# Patient Record
Sex: Female | Born: 1990 | Race: Black or African American | Hispanic: No | Marital: Single | Smoking: Never smoker
Health system: Southern US, Community
[De-identification: ages and names within clinical notes are randomized; demographics above are authoritative.]

## PROBLEM LIST (undated history)

## (undated) DIAGNOSIS — Z789 Other specified health status: Secondary | ICD-10-CM

## (undated) HISTORY — PX: NO PAST SURGERIES: SHX2092

---

## 2017-01-25 ENCOUNTER — Encounter (HOSPITAL_COMMUNITY): Payer: Self-pay

## 2017-01-25 ENCOUNTER — Other Ambulatory Visit (HOSPITAL_COMMUNITY): Payer: Self-pay | Admitting: Nurse Practitioner

## 2017-01-25 DIAGNOSIS — Z3682 Encounter for antenatal screening for nuchal translucency: Secondary | ICD-10-CM

## 2017-01-25 DIAGNOSIS — Z3A13 13 weeks gestation of pregnancy: Secondary | ICD-10-CM

## 2017-01-27 ENCOUNTER — Encounter (HOSPITAL_COMMUNITY): Payer: Self-pay

## 2017-01-27 ENCOUNTER — Ambulatory Visit (HOSPITAL_COMMUNITY)
Admission: RE | Admit: 2017-01-27 | Discharge: 2017-01-27 | Disposition: A | Discharge status: Home / Self Care | Payer: Medicaid Other | Source: Ambulatory Visit | Attending: Nurse Practitioner | Admitting: Nurse Practitioner

## 2017-01-27 DIAGNOSIS — Z3A13 13 weeks gestation of pregnancy: Secondary | ICD-10-CM | POA: Insufficient documentation

## 2017-01-27 DIAGNOSIS — Z3682 Encounter for antenatal screening for nuchal translucency: Secondary | ICD-10-CM | POA: Insufficient documentation

## 2017-01-27 HISTORY — DX: Other specified health status: Z78.9

## 2017-01-27 NOTE — Addendum Note (Signed)
Encounter addended by: Levonne HubertStalter, Octavius Shin M, RDMS, RVT on: 01/27/2017 9:13 AM  Actions taken: Imaging Exam ended

## 2017-02-02 LAB — OB RESULTS CONSOLE VARICELLA ZOSTER ANTIBODY, IGG: VARICELLA IGG: IMMUNE

## 2017-02-02 LAB — OB RESULTS CONSOLE ABO/RH: RH Type: NEGATIVE

## 2017-02-02 LAB — OB RESULTS CONSOLE HEPATITIS B SURFACE ANTIGEN: Hepatitis B Surface Ag: NEGATIVE

## 2017-02-02 LAB — OB RESULTS CONSOLE HIV ANTIBODY (ROUTINE TESTING): HIV: NONREACTIVE

## 2017-02-02 LAB — OB RESULTS CONSOLE RUBELLA ANTIBODY, IGM: Rubella: NON-IMMUNE/NOT IMMUNE

## 2017-02-02 LAB — OB RESULTS CONSOLE RPR: RPR: NONREACTIVE

## 2017-02-13 ENCOUNTER — Other Ambulatory Visit (HOSPITAL_COMMUNITY): Payer: Self-pay

## 2017-02-21 NOTE — L&D Delivery Note (Signed)
Denise Abbott is a 27 y.o. female G3P0020 with IUP at 936w0d admitted for SROM.  She progressed with pitocin augmentation to complete and pushed approximately 2 hours to deliver. The delivery was complicated by a shoulder dystocia    Delivery Note At 12:24 AM a viable female was delivered via Vaginal, Spontaneous (Presentation: LOA).  APGAR: 8, 9; weight pending.   Placenta status: spontaneous, intact.  Cord: 3 vessels. Cord pH: 7.246  At delivery of the head, turtle sign was noted and shoulder dystocia identified.  First Maneuver: Chief Operating OfficerMcRoberts Second Maneuver: Suprapubic pressure, attempted [place] and rubin maneuvers with no movement.  Dr. Jolayne Pantheronstant called to bedside. Code APGAR called.  Third Maneuver: Left side lying Fourth Maneuver: [place] Fifth maneuver: right side lying and delivery of posterior arm  Cord was clamped and cut by CNM and infant handed to awaiting NICU team. See NICU note  Total Time of dystocia: approximately 2 minutes  Anesthesia: epidural Episiotomy: None Lacerations: 2nd degree;Perineal Suture Repair: 3.0 vicryl Est. Blood Loss (mL):  100  Mom to postpartum.  Baby to Couplet care / Skin to Skin.  Rolm BookbinderCaroline M Thad Osoria CNM 07/23/2017, 1:06 AM

## 2017-03-03 ENCOUNTER — Other Ambulatory Visit (HOSPITAL_COMMUNITY): Payer: Self-pay | Admitting: Nurse Practitioner

## 2017-03-03 DIAGNOSIS — Z3689 Encounter for other specified antenatal screening: Secondary | ICD-10-CM

## 2017-03-07 ENCOUNTER — Encounter (HOSPITAL_COMMUNITY): Payer: Self-pay

## 2017-03-07 ENCOUNTER — Ambulatory Visit (HOSPITAL_COMMUNITY)
Admission: RE | Admit: 2017-03-07 | Discharge: 2017-03-07 | Disposition: A | Discharge status: Home / Self Care | Payer: Medicaid Other | Source: Ambulatory Visit | Attending: Nurse Practitioner | Admitting: Nurse Practitioner

## 2017-03-07 ENCOUNTER — Other Ambulatory Visit (HOSPITAL_COMMUNITY): Payer: Self-pay | Admitting: Nurse Practitioner

## 2017-03-07 DIAGNOSIS — Z3A19 19 weeks gestation of pregnancy: Secondary | ICD-10-CM

## 2017-03-07 DIAGNOSIS — Z363 Encounter for antenatal screening for malformations: Secondary | ICD-10-CM

## 2017-03-07 DIAGNOSIS — Z36 Encounter for antenatal screening for chromosomal anomalies: Secondary | ICD-10-CM | POA: Insufficient documentation

## 2017-03-07 DIAGNOSIS — O99311 Alcohol use complicating pregnancy, first trimester: Secondary | ICD-10-CM

## 2017-03-07 DIAGNOSIS — Z3689 Encounter for other specified antenatal screening: Secondary | ICD-10-CM

## 2017-05-15 ENCOUNTER — Encounter (HOSPITAL_COMMUNITY): Payer: Self-pay

## 2017-06-29 LAB — OB RESULTS CONSOLE GC/CHLAMYDIA
CHLAMYDIA, DNA PROBE: NEGATIVE
GC PROBE AMP, GENITAL: NEGATIVE

## 2017-06-29 LAB — OB RESULTS CONSOLE GBS: STREP GROUP B AG: NEGATIVE

## 2017-07-21 ENCOUNTER — Inpatient Hospital Stay (HOSPITAL_COMMUNITY)
Admission: AD | Admit: 2017-07-21 | Discharge: 2017-07-25 | DRG: 807 | Disposition: A | Discharge status: Home / Self Care | Payer: Medicaid Other | Attending: Obstetrics and Gynecology | Admitting: Obstetrics and Gynecology

## 2017-07-21 ENCOUNTER — Other Ambulatory Visit: Payer: Self-pay

## 2017-07-21 ENCOUNTER — Encounter (HOSPITAL_COMMUNITY): Payer: Self-pay | Admitting: *Deleted

## 2017-07-21 DIAGNOSIS — Z6791 Unspecified blood type, Rh negative: Secondary | ICD-10-CM

## 2017-07-21 DIAGNOSIS — O26893 Other specified pregnancy related conditions, third trimester: Secondary | ICD-10-CM | POA: Diagnosis present

## 2017-07-21 DIAGNOSIS — O4292 Full-term premature rupture of membranes, unspecified as to length of time between rupture and onset of labor: Secondary | ICD-10-CM | POA: Diagnosis present

## 2017-07-21 DIAGNOSIS — Z3A38 38 weeks gestation of pregnancy: Secondary | ICD-10-CM | POA: Diagnosis not present

## 2017-07-21 DIAGNOSIS — O4290 Premature rupture of membranes, unspecified as to length of time between rupture and onset of labor, unspecified weeks of gestation: Secondary | ICD-10-CM | POA: Diagnosis present

## 2017-07-21 LAB — CBC
HEMATOCRIT: 37.2 % (ref 36.0–46.0)
Hemoglobin: 12.3 g/dL (ref 12.0–15.0)
MCH: 29.1 pg (ref 26.0–34.0)
MCHC: 33.1 g/dL (ref 30.0–36.0)
MCV: 88.2 fL (ref 78.0–100.0)
PLATELETS: 251 10*3/uL (ref 150–400)
RBC: 4.22 MIL/uL (ref 3.87–5.11)
RDW: 14.5 % (ref 11.5–15.5)
WBC: 9.6 10*3/uL (ref 4.0–10.5)

## 2017-07-21 LAB — POCT FERN TEST: POCT Fern Test: POSITIVE

## 2017-07-21 MED ORDER — FENTANYL CITRATE (PF) 100 MCG/2ML IJ SOLN
100.0000 ug | INTRAMUSCULAR | Status: DC | PRN
  Administered 2017-07-22 – 2017-07-23 (×2): 100 ug via INTRAVENOUS
  Filled 2017-07-21 (×2): qty 2

## 2017-07-21 MED ORDER — ACETAMINOPHEN 325 MG PO TABS: 650.0000 mg | ORAL_TABLET | ORAL | Status: DC | PRN

## 2017-07-21 MED ORDER — LACTATED RINGERS IV SOLN
INTRAVENOUS | Status: DC
  Administered 2017-07-21: 23:00:00 via INTRAVENOUS

## 2017-07-21 MED ORDER — OXYCODONE-ACETAMINOPHEN 5-325 MG PO TABS: 2.0000 | ORAL_TABLET | ORAL | Status: DC | PRN

## 2017-07-21 MED ORDER — OXYTOCIN 40 UNITS IN LACTATED RINGERS INFUSION - SIMPLE MED
2.5000 [IU]/h | INTRAVENOUS | Status: DC
  Administered 2017-07-23: 2.5 [IU]/h via INTRAVENOUS

## 2017-07-21 MED ORDER — SOD CITRATE-CITRIC ACID 500-334 MG/5ML PO SOLN: 30.0000 mL | ORAL | Status: DC | PRN

## 2017-07-21 MED ORDER — ONDANSETRON HCL 4 MG/2ML IJ SOLN: 4.0000 mg | Freq: Four times a day (QID) | INTRAMUSCULAR | Status: DC | PRN

## 2017-07-21 MED ORDER — LIDOCAINE HCL (PF) 1 % IJ SOLN
30.0000 mL | INTRAMUSCULAR | Status: AC | PRN
  Administered 2017-07-23: 30 mL via SUBCUTANEOUS
  Filled 2017-07-21: qty 30

## 2017-07-21 MED ORDER — LACTATED RINGERS IV SOLN
500.0000 mL | INTRAVENOUS | Status: DC | PRN
  Administered 2017-07-22: 500 mL via INTRAVENOUS

## 2017-07-21 MED ORDER — OXYTOCIN BOLUS FROM INFUSION
500.0000 mL | Freq: Once | INTRAVENOUS | Status: AC
  Administered 2017-07-23: 500 mL via INTRAVENOUS

## 2017-07-21 MED ORDER — OXYCODONE-ACETAMINOPHEN 5-325 MG PO TABS: 1.0000 | ORAL_TABLET | ORAL | Status: DC | PRN

## 2017-07-21 NOTE — MAU Note (Signed)
Leaking fld for an hour. Clear fld. No pain. Does not know how dilated at last exam 2 wks ago. Was not cked this wk at appt

## 2017-07-21 NOTE — H&P (Signed)
Denise Abbott is a 27 y.o. female G3P0020 @ 38.wks by 13wk U/S presenting for leaking fluid since 2100. Feeling some mild cramping but no painful ctx. Denies H/A, N/V/D or visual disturbances. Her preg has been followed by the [facility] service and has been essentially unremarkable other than 1) Rh neg 2) rubella nonimmune 3) EIF in LV 4) GBS neg  OB History    Gravida  3   Para      Term      Preterm      AB  2   Living  0     SAB      TAB  2   Ectopic      Multiple      Live Births             Past Medical History:  Diagnosis Date  . Medical history non-contributory    Past Surgical History:  Procedure Laterality Date  . NO PAST SURGERIES     Family History: family history is not on file. Social History:  reports that she has never smoked. She has never used smokeless tobacco. She reports that she does not drink alcohol or use drugs.     Maternal Diabetes: No Genetic Screening: Normal Maternal Ultrasounds/Referrals: Abnormal:  Findings:   Isolated EIF (echogenic intracardiac focus) Fetal Ultrasounds or other Referrals:  None Maternal Substance Abuse:  No Significant Maternal Medications:  None Significant Maternal Lab Results:  Lab values include: Group B Strep negative, Rh negative Other Comments:  None  ROS History Dilation: 2 Effacement (%): 70, 80 Station: -3 Exam by:: K. weissRN Blood pressure 114/72, pulse (!) 117, temperature 99.1 F (37.3 C), resp. rate 18, height 5' 9" (1.753 m), weight 82.6 kg (182 lb), last menstrual period 10/26/2016. Exam Physical Exam  Constitutional: She is oriented to person, place, and time. She appears well-developed.  HENT:  Head: Normocephalic.  Neck: Normal range of motion.  Cardiovascular:  Sl tachycardic  Respiratory: Effort normal.  GI:  FHR 140s, decreased variability on initial monitoring, occ variable, 10x10accels Ctx irreg  Genitourinary:  Genitourinary Comments: Leaking clear fluid  Neurological: She  is alert and oriented to person, place, and time.  Skin: Skin is warm and dry.  Psychiatric: She has a normal mood and affect. Her behavior is normal. Thought content normal.    Prenatal labs: (02/02/17) ABO, Rh:  A neg Antibody:  neg Rubella:  nonimmune RPR:   NR HBsAg:   neg HIV:   NR GBS:   neg (06/29/17)  Assessment/Plan: IUP@term ROM GBS neg  Admit to [facility] Expectant management to start per pt request; may need augmentation with Pit later on Anticipate SVD Will need Rhogam work up/rec MMR postpartum   SHAW, KIMBERLY CNM 07/21/2017, 10:45 PM    

## 2017-07-22 ENCOUNTER — Inpatient Hospital Stay (HOSPITAL_COMMUNITY): Payer: Medicaid Other | Admitting: Anesthesiology

## 2017-07-22 LAB — RPR: RPR: NONREACTIVE

## 2017-07-22 MED ORDER — EPHEDRINE 5 MG/ML INJ
10.0000 mg | INTRAVENOUS | Status: DC | PRN
  Filled 2017-07-22: qty 2

## 2017-07-22 MED ORDER — PHENYLEPHRINE 40 MCG/ML (10ML) SYRINGE FOR IV PUSH (FOR BLOOD PRESSURE SUPPORT)
80.0000 ug | PREFILLED_SYRINGE | INTRAVENOUS | Status: DC | PRN
  Filled 2017-07-22: qty 10
  Filled 2017-07-22: qty 5

## 2017-07-22 MED ORDER — DIPHENHYDRAMINE HCL 50 MG/ML IJ SOLN: 12.5000 mg | INTRAMUSCULAR | Status: DC | PRN

## 2017-07-22 MED ORDER — LIDOCAINE HCL (PF) 1 % IJ SOLN
INTRAMUSCULAR | Status: DC | PRN
  Administered 2017-07-22: 5 mL via EPIDURAL

## 2017-07-22 MED ORDER — BUPIVACAINE HCL (PF) 0.25 % IJ SOLN
INTRAMUSCULAR | Status: DC | PRN
  Administered 2017-07-22: 8 mL via EPIDURAL

## 2017-07-22 MED ORDER — PHENYLEPHRINE 40 MCG/ML (10ML) SYRINGE FOR IV PUSH (FOR BLOOD PRESSURE SUPPORT): 80.0000 ug | PREFILLED_SYRINGE | INTRAVENOUS | Status: DC | PRN

## 2017-07-22 MED ORDER — EPHEDRINE 5 MG/ML INJ: 10.0000 mg | INTRAVENOUS | Status: DC | PRN

## 2017-07-22 MED ORDER — LACTATED RINGERS IV SOLN: 500.0000 mL | Freq: Once | INTRAVENOUS | Status: DC

## 2017-07-22 MED ORDER — OXYTOCIN 40 UNITS IN LACTATED RINGERS INFUSION - SIMPLE MED
1.0000 m[IU]/min | INTRAVENOUS | Status: DC
  Administered 2017-07-22: 2 m[IU]/min via INTRAVENOUS
  Filled 2017-07-22: qty 1000

## 2017-07-22 MED ORDER — FENTANYL 2.5 MCG/ML BUPIVACAINE 1/10 % EPIDURAL INFUSION (WH - ANES)
14.0000 mL/h | INTRAMUSCULAR | Status: DC | PRN
  Administered 2017-07-22 – 2017-07-23 (×3): 14 mL/h via EPIDURAL
  Filled 2017-07-22 (×3): qty 100

## 2017-07-22 MED ORDER — TERBUTALINE SULFATE 1 MG/ML IJ SOLN
0.2500 mg | Freq: Once | INTRAMUSCULAR | Status: DC | PRN
  Filled 2017-07-22: qty 1

## 2017-07-22 MED ORDER — PHENYLEPHRINE 40 MCG/ML (10ML) SYRINGE FOR IV PUSH (FOR BLOOD PRESSURE SUPPORT)
80.0000 ug | PREFILLED_SYRINGE | INTRAVENOUS | Status: DC | PRN
  Filled 2017-07-22: qty 5

## 2017-07-22 MED ORDER — SODIUM BICARBONATE 8.4 % IV SOLN
INTRAVENOUS | Status: DC | PRN
  Administered 2017-07-22: 3 mL via EPIDURAL

## 2017-07-22 NOTE — Progress Notes (Signed)
LABOR PROGRESS NOTE  Denise Abbott is a 27 y.o. G3P0020 at 3157w6d  admitted for PROM Subjective: As pt was being redosed for the epidural, fetus experienced rapid decel w/ HR to the 50s.   Objective: BP (!) 146/78   Pulse 92   Temp 98.6 F (37 C) (Oral)   Resp 16   Ht 5\' 9"  (1.753 m)   Wt 182 lb (82.6 kg)   LMP 10/26/2016   BMI 26.88 kg/m  or  Vitals:   07/22/17 1531 07/22/17 1602 07/22/17 1728 07/22/17 1736  BP: 119/68 105/68 110/67 (!) 146/78  Pulse: 70 (!) 57 92 92  Resp: 16     Temp: 98.6 F (37 C)     TempSrc: Oral     Weight:      Height:       Cervix: Rapid change from 5-7 cm dilation FHT: origanolly in 50s, recovered to 140s after NRB O2, fetal scalp electrode placemnt. Mod var   Labs: Lab Results  Component Value Date   WBC 9.6 07/21/2017   HGB 12.3 07/21/2017   HCT 37.2 07/21/2017   MCV 88.2 07/21/2017   PLT 251 07/21/2017    Patient Active Problem List   Diagnosis Date Noted  . Amniotic fluid leaking 07/21/2017    Assessment / Plan: 27 y.o. G3P0020 at 6157w6d here for PROM  Labor: rapid progression from 5 to 7. May have caused FHR do drop. Quickly recovered. Will cont to monitor.  Fetal Wellbeing:  See above Pain Control:  Epidural Anticipated MOD:  NSVD  Garnette GunnerAaron B Thompson, MD PGY-1 6/1/20195:38 PM

## 2017-07-22 NOTE — Progress Notes (Signed)
Plan to allow pt to eat a light breakfast and then assess to start pitocin. Pt ordering now.  NAD.

## 2017-07-22 NOTE — Progress Notes (Signed)
LABOR PROGRESS NOTE  Denise Abbott is a 27 y.o. G3P0020 at 5353w6d  admitted for PROM   Subjective: Feeling comfortable  With epidural  Objective: BP 124/74   Pulse 75   Temp 98.3 F (36.8 C) (Oral)   Resp 16   Ht 5\' 9"  (1.753 m)   Wt 182 lb (82.6 kg)   LMP 10/26/2016   BMI 26.88 kg/m  or  Vitals:   07/22/17 1146 07/22/17 1151 07/22/17 1156 07/22/17 1201  BP: 123/80 117/76 129/75 124/74  Pulse: 76 84 79 75  Resp:      Temp:      TempSrc:      Weight:      Height:       Dilation: 4.5 Effacement (%): 70 Cervical Position: Posterior Station: -1 Presentation: Vertex Exam by:: Jean RosenthalJackson MD FHT: 130, accels present, mod var Uterine activity: every 3-4 min   Labs: Lab Results  Component Value Date   WBC 9.6 07/21/2017   HGB 12.3 07/21/2017   HCT 37.2 07/21/2017   MCV 88.2 07/21/2017   PLT 251 07/21/2017    Patient Active Problem List   Diagnosis Date Noted  . Amniotic fluid leaking 07/21/2017    Assessment / Plan: 27 y.o. G3P0020 at 3053w6d here for PROM  Labor: progressing with pit Fetal Wellbeing:  Cat 1 Pain Control:  epi Anticipated MOD:  NSVD  Garnette GunnerAaron B Thompson, MD PGY-1 6/1/201912:12 PM

## 2017-07-22 NOTE — Anesthesia Pain Management Evaluation Note (Signed)
  CRNA Pain Management Visit Note  Patient: Denise Abbott, 27 y.o., female  "Hello I am a member of the anesthesia team at [facility]. We have an anesthesia team available at all times to provide care throughout the hospital, including epidural management and anesthesia for C-section. I don't know your plan for the delivery whether it a natural birth, water birth, IV sedation, nitrous supplementation, doula or epidural, but we want to meet your pain goals."   1.Was your pain managed to your expectations on prior hospitalizations?   No prior hospitalizations  2.What is your expectation for pain management during this hospitalization?     Epidural  3.How can we help you reach that goal? Epidural in place. Patient said pain decreased but still uncomfortable with contractions. RN gave a patient 2 boluses from pump. Told RN and patient to notify anesthesia if boluses do not help with discomfort.   Record the patient's initial score and the patient's pain goal.   Pain: 5  Pain Goal: 5 [facility] wants you to be able to say your pain was always managed very well.  Denise Abbott 07/22/2017

## 2017-07-22 NOTE — Anesthesia Procedure Notes (Signed)
Epidural Patient location during procedure: OB Start time: 07/22/2017 11:28 AM End time: 07/22/2017 11:34 AM  Staffing Anesthesiologist: Shelton SilvasHollis, Kevin D, MD Performed: anesthesiologist   Preanesthetic Checklist Completed: patient identified, site marked, surgical consent, pre-op evaluation, timeout performed, IV checked, risks and benefits discussed and monitors and equipment checked  Epidural Patient position: sitting Prep: ChloraPrep Patient monitoring: heart rate, continuous pulse ox and blood pressure Approach: midline Location: L3-L4 Injection technique: LOR saline  Needle:  Needle type: Tuohy  Needle gauge: 17 G Needle length: 9 cm Catheter type: closed end flexible Catheter size: 20 Guage Test dose: negative and 1.5% lidocaine  Assessment Events: blood not aspirated, injection not painful, no injection resistance and no paresthesia  Additional Notes LOR @ 5  Patient identified. Risks/Benefits/Options discussed with patient including but not limited to bleeding, infection, nerve damage, paralysis, failed block, incomplete pain control, headache, blood pressure changes, nausea, vomiting, reactions to medications, itching and postpartum back pain. Confirmed with bedside nurse the patient's most recent platelet count. Confirmed with patient that they are not currently taking any anticoagulation, have any bleeding history or any family history of bleeding disorders. Patient expressed understanding and wished to proceed. All questions were answered. Sterile technique was used throughout the entire procedure. Please see nursing notes for vital signs. Test dose was given through epidural catheter and negative prior to continuing to dose epidural or start infusion. Warning signs of high block given to the patient including shortness of breath, tingling/numbness in hands, complete motor block, or any concerning symptoms with instructions to call for help. Patient was given instructions on  fall risk and not to get out of bed. All questions and concerns addressed with instructions to call with any issues or inadequate analgesia.    Reason for block:procedure for pain

## 2017-07-22 NOTE — Progress Notes (Signed)
Patient ID: Denise Abbott, female   DOB: 01-12-1991, 27 y.o.   MRN: [id number]  Feeling some ctx a little stronger than earlier  BP 121/82, other VSS FHR 130s, +accels, no decels, occ variables Ctx irreg Cx deferred  IUP@term  PROM GBS neg  After a light breakfast, will start Pit Anticipate SVD  Cam HaiSHAW, Letetia Romanello CNM 07/22/2017 7:55 AM

## 2017-07-22 NOTE — Anesthesia Preprocedure Evaluation (Signed)
Anesthesia Evaluation  Patient identified by MRN, date of birth, ID band Patient awake    Reviewed: Allergy & Precautions, Patient's Chart, lab work & pertinent test results  Airway Mallampati: I       Dental no notable dental hx.    Pulmonary neg pulmonary ROS,    Pulmonary exam normal        Cardiovascular negative cardio ROS Normal cardiovascular exam     Neuro/Psych negative neurological ROS  negative psych ROS   GI/Hepatic negative GI ROS,   Endo/Other  negative endocrine ROS  Renal/GU      Musculoskeletal negative musculoskeletal ROS (+)   Abdominal   Peds  Hematology negative hematology ROS (+)   Anesthesia Other Findings   Reproductive/Obstetrics (+) Pregnancy                             Anesthesia Physical Anesthesia Plan  ASA: II  Anesthesia Plan: Epidural   Post-op Pain Management:    Induction:   PONV Risk Score and Plan:   Airway Management Planned: Natural Airway  Additional Equipment:   Intra-op Plan:   Post-operative Plan:   Informed Consent: I have reviewed the patients History and Physical, chart, labs and discussed the procedure including the risks, benefits and alternatives for the proposed anesthesia with the patient or authorized representative who has indicated his/her understanding and acceptance.     Plan Discussed with:   Anesthesia Plan Comments: (Lab Results      Component                Value               Date                      WBC                      9.6                 07/21/2017                HGB                      12.3                07/21/2017                HCT                      37.2                07/21/2017                MCV                      88.2                07/21/2017                PLT                      251                 07/21/2017           )        Anesthesia Quick Evaluation

## 2017-07-22 NOTE — Progress Notes (Signed)
Pt has finished eating.  Will start pit.

## 2017-07-23 ENCOUNTER — Encounter (HOSPITAL_COMMUNITY): Payer: Self-pay | Admitting: *Deleted

## 2017-07-23 DIAGNOSIS — Z3A38 38 weeks gestation of pregnancy: Secondary | ICD-10-CM

## 2017-07-23 LAB — COMPREHENSIVE METABOLIC PANEL
ALBUMIN: 2.4 g/dL — AB (ref 3.5–5.0)
ALK PHOS: 138 U/L — AB (ref 38–126)
ALT: 23 U/L (ref 14–54)
ANION GAP: 12 (ref 5–15)
AST: 43 U/L — AB (ref 15–41)
BILIRUBIN TOTAL: 0.8 mg/dL (ref 0.3–1.2)
BUN: 6 mg/dL (ref 6–20)
CALCIUM: 8.9 mg/dL (ref 8.9–10.3)
CO2: 18 mmol/L — AB (ref 22–32)
Chloride: 102 mmol/L (ref 101–111)
Creatinine, Ser: 0.66 mg/dL (ref 0.44–1.00)
GFR calc Af Amer: >60 mL/min (ref >60)
GFR calc non Af Amer: >60 mL/min (ref >60)
GLUCOSE: 102 mg/dL — AB (ref 65–99)
POTASSIUM: 4.1 mmol/L (ref 3.5–5.1)
SODIUM: 132 mmol/L — AB (ref 135–145)
Total Protein: 5.8 g/dL — ABNORMAL LOW (ref 6.5–8.1)

## 2017-07-23 LAB — CBC
HEMATOCRIT: 36.9 % (ref 36.0–46.0)
HEMOGLOBIN: 12.5 g/dL (ref 12.0–15.0)
MCH: 28.9 pg (ref 26.0–34.0)
MCHC: 33.9 g/dL (ref 30.0–36.0)
MCV: 85.4 fL (ref 78.0–100.0)
Platelets: 224 10*3/uL (ref 150–400)
RBC: 4.32 MIL/uL (ref 3.87–5.11)
RDW: 14.3 % (ref 11.5–15.5)
WBC: 15.4 10*3/uL — AB (ref 4.0–10.5)

## 2017-07-23 LAB — PROTEIN / CREATININE RATIO, URINE
Creatinine, Urine: 101 mg/dL
PROTEIN CREATININE RATIO: 0.22 mg/mg{creat} — AB (ref 0.00–0.15)
Total Protein, Urine: 22 mg/dL

## 2017-07-23 MED ORDER — SENNOSIDES-DOCUSATE SODIUM 8.6-50 MG PO TABS
2.0000 | ORAL_TABLET | ORAL | Status: DC
  Administered 2017-07-23 – 2017-07-24 (×2): 2 via ORAL
  Filled 2017-07-23 (×2): qty 2

## 2017-07-23 MED ORDER — WITCH HAZEL-GLYCERIN EX PADS: 1.0000 "application " | MEDICATED_PAD | CUTANEOUS | Status: DC | PRN

## 2017-07-23 MED ORDER — ONDANSETRON HCL 4 MG PO TABS: 4.0000 mg | ORAL_TABLET | ORAL | Status: DC | PRN

## 2017-07-23 MED ORDER — ZOLPIDEM TARTRATE 5 MG PO TABS: 5.0000 mg | ORAL_TABLET | Freq: Every evening | ORAL | Status: DC | PRN

## 2017-07-23 MED ORDER — COCONUT OIL OIL
1.0000 "application " | TOPICAL_OIL | Status: DC | PRN
  Administered 2017-07-24: 1 via TOPICAL
  Filled 2017-07-23: qty 120

## 2017-07-23 MED ORDER — IBUPROFEN 600 MG PO TABS
600.0000 mg | ORAL_TABLET | Freq: Four times a day (QID) | ORAL | Status: DC
  Administered 2017-07-23 – 2017-07-25 (×10): 600 mg via ORAL
  Filled 2017-07-23 (×10): qty 1

## 2017-07-23 MED ORDER — TETANUS-DIPHTH-ACELL PERTUSSIS 5-2.5-18.5 LF-MCG/0.5 IM SUSP: 0.5000 mL | Freq: Once | INTRAMUSCULAR | Status: DC

## 2017-07-23 MED ORDER — SIMETHICONE 80 MG PO CHEW: 80.0000 mg | CHEWABLE_TABLET | ORAL | Status: DC | PRN

## 2017-07-23 MED ORDER — ONDANSETRON HCL 4 MG/2ML IJ SOLN: 4.0000 mg | INTRAMUSCULAR | Status: DC | PRN

## 2017-07-23 MED ORDER — DIBUCAINE 1 % RE OINT: 1.0000 "application " | TOPICAL_OINTMENT | RECTAL | Status: DC | PRN

## 2017-07-23 MED ORDER — ACETAMINOPHEN 325 MG PO TABS
650.0000 mg | ORAL_TABLET | ORAL | Status: DC | PRN
  Administered 2017-07-24: 650 mg via ORAL
  Filled 2017-07-23: qty 2

## 2017-07-23 MED ORDER — PRENATAL MULTIVITAMIN CH
1.0000 | ORAL_TABLET | Freq: Every day | ORAL | Status: DC
  Administered 2017-07-23 – 2017-07-25 (×3): 1 via ORAL
  Filled 2017-07-23 (×3): qty 1

## 2017-07-23 MED ORDER — BENZOCAINE-MENTHOL 20-0.5 % EX AERO
1.0000 "application " | INHALATION_SPRAY | CUTANEOUS | Status: DC | PRN
  Administered 2017-07-23: 1 via TOPICAL
  Filled 2017-07-23: qty 56

## 2017-07-23 MED ORDER — DIPHENHYDRAMINE HCL 25 MG PO CAPS: 25.0000 mg | ORAL_CAPSULE | Freq: Four times a day (QID) | ORAL | Status: DC | PRN

## 2017-07-23 NOTE — Lactation Note (Signed)
This note was copied from a baby's chart. Lactation Consultation Note  Patient Name: Denise Abbott Date: 07/23/2017 Reason for consult: Follow-up assessment;Nipple pain/trauma Mom is having a difficult time with painful latches.  Mom has firm breasts, erect nipples and semi compressible tissue.  Baby can only achieve a shallow latch even with chin tug.  Mom feels pinching and baby dimples with sucking.  Attempted cross cradle and side lying positions with no improvement.  20 mm nipple shield applied but baby would not latch with shield.  Shells given to wear between feeds and manual pump to pre pump prior to latch.  Baby fed for 10 minutes off and on in football hold.  Still some pinching.  Hand expression attempted to spoon feed but only one drop expressed.  Encouraged mom to continue working with baby to open wide.  Maternal Data Has patient been taught Hand Expression?: Yes Does the patient have breastfeeding experience prior to this delivery?: No  Feeding Feeding Type: Breast Fed Length of feed: 10 min  LATCH Score Latch: Repeated attempts needed to sustain latch, nipple held in mouth throughout feeding, stimulation needed to elicit sucking reflex.  Audible Swallowing: A few with stimulation  Type of Nipple: Everted at rest and after stimulation  Comfort (Breast/Nipple): Filling, red/small blisters or bruises, mild/mod discomfort  Hold (Positioning): Assistance needed to correctly position infant at breast and maintain latch.  LATCH Score: 6  Interventions Interventions: Assisted with latch;Breast compression;Skin to skin;Adjust position;Breast massage;Support pillows;Hand express;Position options;Hand pump  Lactation Tools Discussed/Used Tools: Shells   Consult Status Consult Status: Follow-up Date: 07/24/17 Follow-up type: In-patient    Huston FoleyMOULDEN, Bexlee Bergdoll S 07/23/2017, 2:22 PM

## 2017-07-23 NOTE — Lactation Note (Signed)
This note was copied from a baby's chart. Lactation Consultation Note  Patient Name: Denise Elbert EwingsJacquel Kowal ZOXWR'UToday's Date: 07/23/2017 Reason for consult: Follow-up assessment;Difficult latch;Nipple pain/trauma  5319 hours old female who is being exclusively formula fed by his mother, RN called lactation for assistance due to difficult latch. Mom was trying to feed baby when exiting the room, offered assistance with latch, LC took baby STS to mom's right breast and baby was able to latch on in football position with very little difficulty. Some dimpling and clicking noticed in the beginning, show mom how to break the latch and re-latch baby. Clicking and dimpling stopped, one of visitors took a pic of baby's wide open mouth when nursing, he had a very good deep latch, swallows were heard and no need to use NS this time.  Mom is still complaining of sore nipples, but they're still looking intact upon examination, she's probably experiencing transient soreness. Treatment for sore nipple was reviewed. Mom was very receptive and had lots of questions. Mom reported all questions were answered and will call again if she needs further assistance.  Maternal Data    Feeding Feeding Type: Breast Fed Length of feed: 10 min(baby still nursing when exiting the room)  LATCH Score Latch: Grasps breast easily, tongue down, lips flanged, rhythmical sucking.  Audible Swallowing: A few with stimulation  Type of Nipple: Everted at rest and after stimulation  Comfort (Breast/Nipple): Filling, red/small blisters or bruises, mild/mod discomfort  Hold (Positioning): Assistance needed to correctly position infant at breast and maintain latch.  LATCH Score: 7  Interventions Interventions: Breast feeding basics reviewed;Assisted with latch;Skin to skin;Adjust position;Support pillows;Position options;Breast massage;Breast compression  Lactation Tools Discussed/Used     Consult Status Consult Status: Follow-up Date:  07/24/17 Follow-up type: In-patient    Daisia Slomski Venetia ConstableS Alpha Mysliwiec 07/23/2017, 8:12 PM

## 2017-07-23 NOTE — Anesthesia Postprocedure Evaluation (Signed)
Anesthesia Post Note  Patient: Elbert EwingsJacquel Mcburney  Procedure(s) Performed: AN AD HOC LABOR EPIDURAL     Patient location during evaluation: Mother Baby Anesthesia Type: Epidural Level of consciousness: awake and alert Pain management: pain level controlled Vital Signs Assessment: post-procedure vital signs reviewed and stable Respiratory status: spontaneous breathing, nonlabored ventilation and respiratory function stable Cardiovascular status: stable Postop Assessment: no headache, no backache, epidural receding and patient able to bend at knees Anesthetic complications: no    Last Vitals:  Vitals:   07/23/17 0319 07/23/17 0628  BP: 120/79 121/79  Pulse: (!) 109 (!) 114  Resp: 18 18  Temp: 36.9 C 36.8 C  SpO2: 100% 98%    Last Pain:  Vitals:   07/23/17 0720  TempSrc:   PainSc: Asleep   Pain Goal: Patients Stated Pain Goal: 2 (07/23/17 0640)               Rica RecordsICKELTON,Maylani Embree

## 2017-07-23 NOTE — Consult Note (Signed)
Neonatology Note:  Attendance at Code Apgar:   Our team responded to a Code Apgar call to room # 164 due to shoulder dystocia.  The requesting physician was Dr. Constant. The mother is a G3P0A2 A neg, Rubella NI, GBS neg with ultrasound finding of an isolated echogenic intracardiac focus. ROM occurred 27 hours PTD and the fluid was clear; mother was afebrile and she did not get antibiotics before delivery.  After delivery, the baby began to cry spontaneously. Our team arrived just as the baby was delivered. I examined the baby, who appeared well and in no distress; he was moving both arms with strength and there was no palpable clavicle fracture. Ap 8/9.  I spoke with the parents in the DR, then transferred the baby to the Pediatrician's care.   Coyt Govoni C. Alireza Pollack, MD   

## 2017-07-23 NOTE — Lactation Note (Signed)
This note was copied from a baby's chart. Lactation Consultation Note  Patient Name: Denise Abbott ZOXWR'UToday's Date: 07/23/2017   P1 mother whose infant is now 655 hours old  Mother holding baby in the cradle hold as I entered; loud smacking noises heard.  I offered to assist mother with latch and she accepted.  Explained to mother that loud noises during breastfeeding means the baby is not latched correctly.  Upon observation, he was sucking on his lower lip while maintaining a shallow latch.  His cheeks were dimpling.  Positioned mother in the football hold and assisted baby to latch onto the right breast.  After a couple of attempts he latched deeply.  A gentle chin tug was needed to obtain an appropriate grasp with flanged lips.  Mother stated no pain with the latch.  Encouraged to feed 8-12 times/24 hours or earlier if he shows feeding cues.  Reviewed feeding cues.  Continue STS, breast massage and hand expression.  Demonstrated hand expression with mother doing a return demonstration.  A few drops of colostrum were expressed.  Mother taught to rub colostrum back into nipples and areola.    Mom made aware of O/P services, breastfeeding support groups, community resources, and our phone # for post-discharge questions. Mother will call for latch assistance as needed.     Maternal Data Formula Feeding for Exclusion: No Has patient been taught Hand Expression?: No Does the patient have breastfeeding experience prior to this delivery?: No  Feeding Feeding Type: Breast Fed Length of feed: 15 min  LATCH Score Latch: Grasps breast easily, tongue down, lips flanged, rhythmical sucking.  Audible Swallowing: None  Type of Nipple: Everted at rest and after stimulation  Comfort (Breast/Nipple): Soft / non-tender  Hold (Positioning): Assistance needed to correctly position infant at breast and maintain latch.  LATCH Score: 7  Interventions    Lactation Tools Discussed/Used      Consult Status      Denise Abbott Levana Minetti 07/23/2017, 4:33 AM

## 2017-07-24 LAB — CBC
HCT: 31.8 % — ABNORMAL LOW (ref 36.0–46.0)
HEMOGLOBIN: 10.5 g/dL — AB (ref 12.0–15.0)
MCH: 29.2 pg (ref 26.0–34.0)
MCHC: 33 g/dL (ref 30.0–36.0)
MCV: 88.6 fL (ref 78.0–100.0)
Platelets: 215 10*3/uL (ref 150–400)
RBC: 3.59 MIL/uL — ABNORMAL LOW (ref 3.87–5.11)
RDW: 14.8 % (ref 11.5–15.5)
WBC: 12.2 10*3/uL — ABNORMAL HIGH (ref 4.0–10.5)

## 2017-07-24 MED ORDER — MEASLES, MUMPS & RUBELLA VAC ~~LOC~~ INJ
0.5000 mL | INJECTION | Freq: Once | SUBCUTANEOUS | Status: AC
  Administered 2017-07-25: 0.5 mL via SUBCUTANEOUS
  Filled 2017-07-24 (×2): qty 0.5

## 2017-07-24 MED ORDER — RHO D IMMUNE GLOBULIN 1500 UNIT/2ML IJ SOSY
300.0000 ug | PREFILLED_SYRINGE | Freq: Once | INTRAMUSCULAR | Status: AC
  Administered 2017-07-24: 300 ug via INTRAMUSCULAR
  Filled 2017-07-24: qty 2

## 2017-07-24 NOTE — Plan of Care (Signed)
  Problem: Elimination: Goal: Will not experience complications related to bowel motility Outcome: Progressing  Pt reports LBM was Friday 5/31.  Pt reports she is passing gas and her bowel sounds are active, abdomen non distended, soft and non tender.  Pt encouraged to increase fluid intake, increase ambulation, and eating high fiber foods.  Pt verbalized understanding.

## 2017-07-24 NOTE — Progress Notes (Signed)
Patient ID: Denise Abbott, female   DOB: 1990/05/15, 27 y.o.   MRN: [id number] PPD # 1 SVD  S:  Reports feeling tired. "I haven't slept yet".             Tolerating po/ No nausea or vomiting             Bleeding is spotting             Pain controlled with ibuprofen (OTC)             Up ad lib / ambulatory / voiding without difficulties     Information for the patient's newborn:  Denise Abbott, Denise Abbott [[id number]][[id number]]  female    breast feeding  / Circumcision planning to be done outpatinet   O:  A & O x 3, in no apparent distress              VS:  Vitals:   07/23/17 0319 07/23/17 0628 07/23/17 1922 07/24/17 0531  BP: 120/79 121/79 117/67 109/65  Pulse: (!) 109 (!) 114 90 94  Resp: 18 18 18 20   Temp: 98.4 F (36.9 C) 98.3 F (36.8 C)    TempSrc: Oral Oral    SpO2: 100% 98%    Weight:      Height:        LABS:  Recent Labs    07/23/17 0121 07/24/17 0525  WBC 15.4* 12.2*  HGB 12.5 10.5*  HCT 36.9 31.8*  PLT 224 215    Blood type: --/--/A NEG (06/03 0525)  Rubella: Nonimmune (12/13 0000)   I&O: I/O last 3 completed shifts: In: -  Out: 551 [Urine:451; Blood:100]          No intake/output data recorded.    Abdomen: soft, non-tender, non-distended             Fundus: firm, non-tender, U-2  Perineum: 2nd degree repair, no edema  Lochia: spotting  Extremities: no edema, no calf pain or tenderness    A/P: PPD # 1 27 y.o., L2G4010G3P1021   Active Problems:   Amniotic fluid leaking    Doing well - stable status  Routine post partum orders  Anticipate discharge tomorrow    Raelyn Moraolitta Jaselynn Tamas, MSN, CNM 07/24/2017, 1:01 PM

## 2017-07-24 NOTE — Lactation Note (Signed)
This note was copied from a baby's chart. Lactation Consultation Note  Patient Name: Denise Abbott Reason for consult: Follow-up assessment Mom reports pain is better with feeding using a nipple shield.  Mom applying colostrum and coconut oil to nipples after feeds.  Discussed milk coming to volume and engorgement treatment.  Mom has a manual pump for home use.  Questions answered.  Lactation outpatient services and support information reviewed and encouraged prn.  Maternal Data    Feeding Feeding Type: Breast Fed  LATCH Score Latch: Repeated attempts needed to sustain latch, nipple held in mouth throughout feeding, stimulation needed to elicit sucking reflex.  Audible Swallowing: A few with stimulation  Type of Nipple: Everted at rest and after stimulation  Comfort (Breast/Nipple): Filling, red/small blisters or bruises, mild/mod discomfort  Hold (Positioning): No assistance needed to correctly position infant at breast.  LATCH Score: 7  Interventions    Lactation Tools Discussed/Used     Consult Status Consult Status: Complete Follow-up type: Call as needed    Huston FoleyMOULDEN, [name], 9:56 AM

## 2017-07-25 LAB — TYPE AND SCREEN
ABO/RH(D): A NEG
Antibody Screen: POSITIVE
UNIT DIVISION: 0
Unit division: 0

## 2017-07-25 LAB — RH IG WORKUP (INCLUDES ABO/RH)
ABO/RH(D): A NEG
Fetal Screen: NEGATIVE
Gestational Age(Wks): 39
UNIT DIVISION: 0

## 2017-07-25 LAB — BPAM RBC
BLOOD PRODUCT EXPIRATION DATE: 201906302359
BLOOD PRODUCT EXPIRATION DATE: 201906302359
UNIT TYPE AND RH: 600
UNIT TYPE AND RH: 600

## 2017-07-25 MED ORDER — IBUPROFEN 600 MG PO TABS: 600.0000 mg | ORAL_TABLET | Freq: Four times a day (QID) | ORAL | 0 refills | Status: AC

## 2017-07-25 NOTE — Discharge Instructions (Signed)
Vaginal Delivery, Care After °Refer to this sheet in the next few weeks. These instructions provide you with information about caring for yourself after vaginal delivery. Your health care provider may also give you more specific instructions. Your treatment has been planned according to current medical practices, but problems sometimes occur. Call your health care provider if you have any problems or questions. °What can I expect after the procedure? °After vaginal delivery, it is common to have: °· Some bleeding from your vagina. °· Soreness in your abdomen, your vagina, and the area of skin between your vaginal opening and your anus (perineum). °· Pelvic cramps. °· Fatigue. ° °Follow these instructions at home: °Medicines °· Take over-the-counter and prescription medicines only as told by your health care provider. °· If you were prescribed an antibiotic medicine, take it as told by your health care provider. Do not stop taking the antibiotic until it is finished. °Driving ° °· Do not drive or operate heavy machinery while taking prescription pain medicine. °· Do not drive for 24 hours if you received a sedative. °Lifestyle °· Do not drink alcohol. This is especially important if you are breastfeeding or taking medicine to relieve pain. °· Do not use tobacco products, including cigarettes, chewing tobacco, or e-cigarettes. If you need help quitting, ask your health care provider. °Eating and drinking °· Drink at least 8 eight-ounce glasses of water every day unless you are told not to by your health care provider. If you choose to breastfeed your baby, you may need to drink more water than this. °· Eat high-fiber foods every day. These foods may help prevent or relieve constipation. High-fiber foods include: °? Whole grain cereals and breads. °? Brown rice. °? Beans. °? Fresh fruits and vegetables. °Activity °· Return to your normal activities as told by your health care provider. Ask your health care provider  what activities are safe for you. °· Rest as much as possible. Try to rest or take a nap when your baby is sleeping. °· Do not lift anything that is heavier than your baby or 10 lb (4.5 kg) until your health care provider says that it is safe. °· Talk with your health care provider about when you can engage in sexual activity. This may depend on your: °? Risk of infection. °? Rate of healing. °? Comfort and desire to engage in sexual activity. °Vaginal Care °· If you have an episiotomy or a vaginal tear, check the area every day for signs of infection. Check for: °? More redness, swelling, or pain. °? More fluid or blood. °? Warmth. °? Pus or a bad smell. °· Do not use tampons or douches until your health care provider says this is safe. °· Watch for any blood clots that may pass from your vagina. These may look like clumps of dark red, brown, or black discharge. °General instructions °· Keep your perineum clean and dry as told by your health care provider. °· Wear loose, comfortable clothing. °· Wipe from front to back when you use the toilet. °· Ask your health care provider if you can shower or take a bath. If you had an episiotomy or a perineal tear during labor and delivery, your health care provider may tell you not to take baths for a certain length of time. °· Wear a bra that supports your breasts and fits you well. °· If possible, have someone help you with household activities and help care for your baby for at least a few days after   you leave the hospital. °· Keep all follow-up visits for you and your baby as told by your health care provider. This is important. °Contact a health care provider if: °· You have: °? Vaginal discharge that has a bad smell. °? Difficulty urinating. °? Pain when urinating. °? A sudden increase or decrease in the frequency of your bowel movements. °? More redness, swelling, or pain around your episiotomy or vaginal tear. °? More fluid or blood coming from your episiotomy or  vaginal tear. °? Pus or a bad smell coming from your episiotomy or vaginal tear. °? A fever. °? A rash. °? Little or no interest in activities you used to enjoy. °? Questions about caring for yourself or your baby. °· Your episiotomy or vaginal tear feels warm to the touch. °· Your episiotomy or vaginal tear is separating or does not appear to be healing. °· Your breasts are painful, hard, or turn red. °· You feel unusually sad or worried. °· You feel nauseous or you vomit. °· You pass large blood clots from your vagina. If you pass a blood clot from your vagina, save it to show to your health care provider. Do not flush blood clots down the toilet without having your health care provider look at them. °· You urinate more than usual. °· You are dizzy or light-headed. °· You have not breastfed at all and you have not had a menstrual period for 12 weeks after delivery. °· You have stopped breastfeeding and you have not had a menstrual period for 12 weeks after you stopped breastfeeding. °Get help right away if: °· You have: °? Pain that does not go away or does not get better with medicine. °? Chest pain. °? Difficulty breathing. °? Blurred vision or spots in your vision. °? Thoughts about hurting yourself or your baby. °· You develop pain in your abdomen or in one of your legs. °· You develop a severe headache. °· You faint. °· You bleed from your vagina so much that you fill two sanitary pads in one hour. °This information is not intended to replace advice given to you by your health care provider. Make sure you discuss any questions you have with your health care provider. °Document Released: 02/05/2000 Document Revised: 07/22/2015 Document Reviewed: 02/22/2015 °Elsevier Interactive Patient Education © 2018 [employer] ° °

## 2017-07-25 NOTE — Discharge Summary (Signed)
OB Discharge Summary     Patient Name: Denise Abbott DOB: 07-17-1990 MRN: [id number]  Date of admission: 07/21/2017 Delivering MD: Rolm BookbinderNEILL, CAROLINE M   Date of discharge: 07/25/2017  Admitting diagnosis: water broke Intrauterine pregnancy: 3319w0d     Secondary diagnosis:  Principal Problem:   SVD (spontaneous vaginal delivery) Active Problems:   Amniotic fluid leaking  Additional problems: no     Discharge diagnosis: Term Pregnancy Delivered                                                                                                Post partum procedures:rhogam  Augmentation: Pitocin  Complications: None  Hospital course:  Onset of Labor With Vaginal Delivery     27 y.o. yo [id number] at 6819w0d was admitted in Latent Labor and ROM on 07/21/2017. Labor was augmented with Pitocin. Patient had an uncomplicated labor course as follows:  Membrane Rupture Time/Date: 9:00 PM ,07/21/2017   Intrapartum Procedures: Episiotomy: None [1]                                         Lacerations:  2nd degree [3];Perineal [11]  Patient had a delivery of a Viable infant. 07/23/2017  Information for the patient's newborn:  Denise Fakeaylor, Boy [name] [[id number]][[id number]]  Delivery Method: Vaginal, Spontaneous(Filed from Delivery Summary)   Pateint had an uncomplicated postpartum course.  She is ambulating, tolerating a regular diet, passing flatus, and urinating well. Patient is discharged home in stable condition on 07/25/17.   Physical exam  Vitals:   07/23/17 1922 07/24/17 0531 07/24/17 1705 07/25/17 0608  BP: 117/67 109/65 119/72 117/73  Pulse: 90 94 91 76  Resp: 18 20 16 18   Temp:   98.8 F (37.1 C) 98.4 F (36.9 C)  TempSrc:   Oral Oral  SpO2:   100%   Weight:      Height:       General: alert, cooperative and no distress Lochia: appropriate Uterine Fundus: firm Incision: N/A DVT Evaluation: No significant calf/ankle edema. Labs: Lab Results  Component Value Date   WBC 12.2 (H) 07/24/2017   HGB 10.5 (L) 07/24/2017   HCT 31.8 (L) 07/24/2017   MCV 88.6 07/24/2017   PLT 215 07/24/2017   CMP Latest Ref Rng & Units 07/23/2017  Glucose 65 - 99 mg/dL 696(E102(H)  BUN 6 - 20 mg/dL 6  Creatinine 9.520.44 - 8.411.00 mg/dL 3.240.66  Sodium 401135 - 027145 mmol/L 132(L)  Potassium 3.5 - 5.1 mmol/L 4.1  Chloride 101 - 111 mmol/L 102  CO2 22 - 32 mmol/L 18(L)  Calcium 8.9 - 10.3 mg/dL 8.9  Total Protein 6.5 - 8.1 g/dL 2.5(D5.8(L)  Total Bilirubin 0.3 - 1.2 mg/dL 0.8  Alkaline Phos 38 - 126 U/L 138(H)  AST 15 - 41 U/L 43(H)  ALT 14 - 54 U/L 23    Discharge instruction: per After Visit Summary and "Baby and Me Booklet".  After visit meds:  Allergies as of 07/25/2017   No Known Allergies  Medication List    TAKE these medications   acetaminophen 500 MG tablet Commonly known as:  TYLENOL Take 500 mg by mouth every 6 (six) hours as needed for headache.   chlorhexidine 0.12 % solution Commonly known as:  PERIDEX Use as directed 15 mLs in the mouth or throat 2 (two) times daily.   ibuprofen 600 MG tablet Commonly known as:  ADVIL,MOTRIN Take 1 tablet (600 mg total) by mouth every 6 (six) hours.   prenatal multivitamin Tabs tablet Take 1 tablet by mouth daily at 12 noon.       Diet: routine diet  Activity: Advance as tolerated. Pelvic rest for 6 weeks.   Outpatient follow up: 4 weeks at [facility]  Postpartum contraception: IUD Paragard  Newborn Data: Live born female  Birth Weight: 7 lb 15.7 oz (3620 g) APGAR: 8, 9  Newborn Delivery   Birth date/time:  07/23/2017 00:24:00 Delivery type:  Vaginal, Spontaneous     Baby Feeding: Breast Disposition:home with mother   07/25/2017 Frederik Pear, MD

## 2017-07-25 NOTE — Lactation Note (Signed)
This note was copied from a baby's chart. Lactation Consultation Note  Patient Name: Denise Abbott ZOXWR'UToday's Date: 07/25/2017 Reason for consult: Follow-up assessment;Term;Infant weight loss;Primapara;1st time breastfeeding;Nipple pain/trauma(6% weight loss, per mom sore nipples have improved )  Baby is 9559 hours old  As LC entered room, baby latched with depth - see doc flow sheets for details.  LC reviewed breast feeding basics/ sore nipple and engorgement prevention and tx /  LC recommended to mom prior to latch - breast massage , hand express, pre- pump if needed,  Latch with breast compressions.  Comfort shells after feedings , when warm , rinse with warm water, and  Use breast shells.  Sore nipple and engorgement prevention and tx  Mom has a hand pump.  Mother informed of post-discharge support and given phone number to the lactation department, including services for phone call assistance; out-patient appointments; and breastfeeding support group. List of other breastfeeding resources in the community given in the handout. Encouraged mother to call for problems or concerns related to breastfeeding.  Mom has shells, comfort gels, hand pump , coconut oil.    Maternal Data Has patient been taught Hand Expression?: Yes(reviewed - easily expressed milk )  Feeding Feeding Type: (baby already latched - per mom since10 :30 am ) Length of feed: 25 min  LATCH Score     Interventions Interventions: Breast feeding basics reviewed;Skin to skin;Adjust position;Position options;Shells;Comfort gels;Hand pump  Lactation Tools Discussed/Used Tools: Shells;Pump;Coconut oil;Comfort gels(per mom not using the NS any more ) Shell Type: Inverted Breast pump type: Manual WIC Program: Yes Pump Review: Milk Storage(mom already  had a hand pump ) Initiated by:: MAI  Date initiated:: 07/25/17   Consult Status Consult Status: Complete Date: 07/25/17    Denise Abbott 07/25/2017, 11:28  AM

## 2017-07-25 NOTE — Progress Notes (Signed)
Patient given MMR VIS. 

## 2017-12-28 ENCOUNTER — Encounter (HOSPITAL_COMMUNITY): Payer: Self-pay

## 2017-12-28 ENCOUNTER — Other Ambulatory Visit: Payer: Self-pay

## 2017-12-28 ENCOUNTER — Emergency Department (HOSPITAL_COMMUNITY)
Admission: EM | Admit: 2017-12-28 | Discharge: 2017-12-28 | Disposition: A | Discharge status: Home / Self Care | Payer: Medicaid Other | Attending: Emergency Medicine | Admitting: Emergency Medicine

## 2017-12-28 DIAGNOSIS — J020 Streptococcal pharyngitis: Secondary | ICD-10-CM | POA: Insufficient documentation

## 2017-12-28 LAB — GROUP A STREP BY PCR: Group A Strep by PCR: DETECTED — AB

## 2017-12-28 MED ORDER — PENICILLIN G BENZATHINE 1200000 UNIT/2ML IM SUSP
1.2000 10*6.[IU] | Freq: Once | INTRAMUSCULAR | Status: AC
  Administered 2017-12-28: 1.2 10*6.[IU] via INTRAMUSCULAR
  Filled 2017-12-28: qty 2

## 2017-12-28 MED ORDER — HYDROCODONE-ACETAMINOPHEN 7.5-325 MG/15ML PO SOLN: 15.0000 mL | Freq: Four times a day (QID) | ORAL | 0 refills | Status: AC | PRN

## 2017-12-28 MED ORDER — DEXAMETHASONE 1 MG/ML PO CONC
6.0000 mg | Freq: Once | ORAL | Status: AC
Start: 2017-12-28 — End: 2017-12-28
  Administered 2017-12-28: 6 mg via ORAL
  Filled 2017-12-28: qty 6

## 2017-12-28 NOTE — ED Notes (Signed)
Bed: WTR6 Expected date:  Expected time:  Means of arrival:  Comments: 

## 2017-12-28 NOTE — ED Triage Notes (Signed)
Pt states she has had a sore throat for 2 days. Pt states she is a Engineer, site. Pt also states she has an infant at home and does not want to spread to the baby.

## 2017-12-28 NOTE — Discharge Instructions (Addendum)
If the pain cannot be controlled by Tylenol or Motrin I have given a prescription for liquid Vicodin.  Take care of this medicine as it is a narcotic.

## 2017-12-28 NOTE — ED Provider Notes (Signed)
[facility]-EMERGENCY DEPT Provider Note   CSN: [id number] Arrival date & time: 12/28/17  1052     History   Chief Complaint Chief Complaint  Patient presents with  . Sore Throat    HPI Denise Abbott is a 27 y.o. female.  HPI Patient presents with sore throat for 2 days.  Little bit of a cough with no real production.  States she has myalgias but no fever.  She is a Manufacturing systems engineer.  Also has a 55-month-old at home.  Hurts to swallow.  No difficulty breathing.  No difficulty swallowing.  No known sick contacts. Past Medical History:  Diagnosis Date  . Medical history non-contributory     Patient Active Problem List   Diagnosis Date Noted  . SVD (spontaneous vaginal delivery) 07/25/2017  . Amniotic fluid leaking 07/21/2017    Past Surgical History:  Procedure Laterality Date  . NO PAST SURGERIES       OB History    Gravida  3   Para  1   Term  1   Preterm      AB  2   Living  1     SAB      TAB  2   Ectopic      Multiple  0   Live Births  1            Home Medications    Prior to Admission medications   Medication Sig Start Date End Date Taking? Authorizing Provider  acetaminophen (TYLENOL) 500 MG tablet Take 500 mg by mouth every 6 (six) hours as needed for headache.    [provider]  chlorhexidine (PERIDEX) 0.12 % solution Use as directed 15 mLs in the mouth or throat 2 (two) times daily.  06/26/17   [provider]  HYDROcodone-acetaminophen (HYCET) 7.5-325 mg/15 ml solution Take 15 mLs by mouth every 6 (six) hours as needed for moderate pain. 12/28/17   Benjiman Core, MD  ibuprofen (ADVIL,MOTRIN) 600 MG tablet Take 1 tablet (600 mg total) by mouth every 6 (six) hours. 07/25/17   Degele, Kandra Nicolas, MD  Prenatal Vit-Fe Fumarate-FA (PRENATAL MULTIVITAMIN) TABS tablet Take 1 tablet by mouth daily at 12 noon.    [provider]    Family History No family history on file.  Social  History Social History   Tobacco Use  . Smoking status: Never Smoker  . Smokeless tobacco: Never Used  Substance Use Topics  . Alcohol use: No    Frequency: Never  . Drug use: No     Allergies   Patient has no known allergies.   Review of Systems Review of Systems  Constitutional: Positive for appetite change.  HENT: Positive for sore throat. Negative for congestion.   Respiratory: Positive for cough.   Cardiovascular: Negative for chest pain.  Gastrointestinal: Negative for abdominal pain.  Endocrine: Negative for polyuria.  Genitourinary: Negative for flank pain.  Musculoskeletal: Negative for back pain.  Skin: Negative for rash.  Neurological: Negative for weakness.     Physical Exam Updated Vital Signs BP 107/70 (BP Location: Left Arm)   Pulse 65   Temp 98.7 F (37.1 C) (Oral)   Resp 16   Ht 5\' 9"  (1.753 m)   Wt 67.6 kg   LMP 12/27/2017   SpO2 99%   BMI 22.00 kg/m   Physical Exam  HENT:  Head: Normocephalic.  Posterior pharyngeal erythema without exudate.  Uvula midline.  No asymmetric swelling.  No stridor.  Neck: Neck supple.  Cardiovascular: Regular rhythm.  Pulmonary/Chest: She has no wheezes. She has no rales.  Abdominal: Soft. There is no tenderness.  Lymphadenopathy:    She has cervical adenopathy.  Neurological: She is alert.  Skin: Skin is warm. Capillary refill takes less than 2 seconds.     ED Treatments / Results  Labs (all labs ordered are listed, but only abnormal results are displayed) Labs Reviewed  GROUP A STREP BY PCR - Abnormal; Notable for the following components:      Result Value   Group A Strep by PCR DETECTED (*)    All other components within normal limits    EKG None  Radiology No results found.  Procedures Procedures (including critical care time)  Medications Ordered in ED Medications  dexamethasone (DECADRON) 1 MG/ML solution 6 mg (has no administration in time range)  penicillin g benzathine  (BICILLIN LA) 1200000 UNIT/2ML injection 1.2 Million Units (has no administration in time range)     Initial Impression / Assessment and Plan / ED Course  I have reviewed the triage vital signs and the nursing notes.  Pertinent labs & imaging results that were available during my care of the patient were reviewed by me and considered in my medical decision making (see chart for details).    Patient with apparent streptococcal pharyngitis.  Lungs are clear.  No abscess.  Discussed with patient and give IM penicillin.  Steroids also given for symptom relief.  Discharge home.  Patient is not breast-feeding.  Final Clinical Impressions(s) / ED Diagnoses   Final diagnoses:  Strep throat    ED Discharge Orders         Ordered    HYDROcodone-acetaminophen (HYCET) 7.5-325 mg/15 ml solution  Every 6 hours PRN     12/28/17 1154           Benjiman Core, MD 12/28/17 1154

## 2018-02-12 ENCOUNTER — Emergency Department (HOSPITAL_COMMUNITY)
Admission: EM | Admit: 2018-02-12 | Discharge: 2018-02-12 | Disposition: A | Discharge status: Home / Self Care | Payer: Medicaid Other | Attending: Emergency Medicine | Admitting: Emergency Medicine

## 2018-02-12 ENCOUNTER — Encounter (HOSPITAL_COMMUNITY): Payer: Self-pay

## 2018-02-12 ENCOUNTER — Other Ambulatory Visit: Payer: Self-pay

## 2018-02-12 DIAGNOSIS — J111 Influenza due to unidentified influenza virus with other respiratory manifestations: Secondary | ICD-10-CM | POA: Insufficient documentation

## 2018-02-12 DIAGNOSIS — R69 Illness, unspecified: Secondary | ICD-10-CM

## 2018-02-12 DIAGNOSIS — Z79899 Other long term (current) drug therapy: Secondary | ICD-10-CM | POA: Insufficient documentation

## 2018-02-12 MED ORDER — OSELTAMIVIR PHOSPHATE 75 MG PO CAPS: 75.0000 mg | ORAL_CAPSULE | Freq: Two times a day (BID) | ORAL | 0 refills | Status: AC

## 2018-02-12 NOTE — ED Provider Notes (Signed)
[facility]-EMERGENCY DEPT Provider Note   CSN: [id number] Arrival date & time: 02/12/18  0906     History   Chief Complaint Chief Complaint  Patient presents with  . Otalgia  . Sore Throat  . Nasal Congestion  . Generalized Body Aches  . Fever    HPI Denise Abbott is a 27 y.o. female.  27 year old female presents with cough and congestion x24 hours.  Has not myalgias with scratchy throat and mild ear pain.  Mild frontal headache without photophobia or neck pain.  No vomiting or diarrhea.  Has used over-the-counter medications without relief.     Past Medical History:  Diagnosis Date  . Medical history non-contributory     Patient Active Problem List   Diagnosis Date Noted  . SVD (spontaneous vaginal delivery) 07/25/2017  . Amniotic fluid leaking 07/21/2017    Past Surgical History:  Procedure Laterality Date  . NO PAST SURGERIES       OB History    Gravida  3   Para  1   Term  1   Preterm      AB  2   Living  1     SAB      TAB  2   Ectopic      Multiple  0   Live Births  1            Home Medications    Prior to Admission medications   Medication Sig Start Date End Date Taking? Authorizing Provider  acetaminophen (TYLENOL) 500 MG tablet Take 500 mg by mouth every 6 (six) hours as needed for headache.    [provider]  chlorhexidine (PERIDEX) 0.12 % solution Use as directed 15 mLs in the mouth or throat 2 (two) times daily.  06/26/17   [provider]  HYDROcodone-acetaminophen (HYCET) 7.5-325 mg/15 ml solution Take 15 mLs by mouth every 6 (six) hours as needed for moderate pain. 12/28/17   Benjiman CorePickering, Nathan, MD  ibuprofen (ADVIL,MOTRIN) 600 MG tablet Take 1 tablet (600 mg total) by mouth every 6 (six) hours. 07/25/17   Degele, Kandra NicolasJulie P, MD  Prenatal Vit-Fe Fumarate-FA (PRENATAL MULTIVITAMIN) TABS tablet Take 1 tablet by mouth daily at 12 noon.    [provider]    Family  History Family History  Problem Relation Age of Onset  . Healthy Mother   . Healthy Father     Social History Social History   Tobacco Use  . Smoking status: Never Smoker  . Smokeless tobacco: Never Used  Substance Use Topics  . Alcohol use: No    Frequency: Never  . Drug use: No     Allergies   Patient has no known allergies.   Review of Systems Review of Systems  All other systems reviewed and are negative.    Physical Exam Updated Vital Signs BP 119/81   Pulse (!) 102   Temp 97.9 F (36.6 C) (Oral)   Resp 18   Ht 1.753 m (5\' 9" )   Wt 66.2 kg   LMP 02/10/2018   SpO2 100%   BMI 21.56 kg/m   Physical Exam Vitals signs and nursing note reviewed.  Constitutional:      General: She is not in acute distress.    Appearance: Normal appearance. She is well-developed. She is not toxic-appearing.  HENT:     Head: Normocephalic and atraumatic.     Mouth/Throat:     Pharynx: No pharyngeal swelling or oropharyngeal exudate.  Eyes:  General: Lids are normal.     Conjunctiva/sclera: Conjunctivae normal.     Pupils: Pupils are equal, round, and reactive to light.  Neck:     Musculoskeletal: Normal range of motion and neck supple.     Thyroid: No thyroid mass.     Trachea: No tracheal deviation.  Cardiovascular:     Rate and Rhythm: Normal rate and regular rhythm.     Heart sounds: Normal heart sounds. No murmur. No gallop.   Pulmonary:     Effort: Pulmonary effort is normal. No respiratory distress.     Breath sounds: Normal breath sounds. No stridor. No decreased breath sounds, wheezing, rhonchi or rales.  Abdominal:     General: Bowel sounds are normal. There is no distension.     Palpations: Abdomen is soft.     Tenderness: There is no abdominal tenderness. There is no rebound.  Musculoskeletal: Normal range of motion.        General: No tenderness.  Skin:    General: Skin is warm and dry.     Findings: No abrasion or rash.  Neurological:     Mental  Status: She is alert and oriented to person, place, and time.     GCS: GCS eye subscore is 4. GCS verbal subscore is 5. GCS motor subscore is 6.     Cranial Nerves: No cranial nerve deficit.     Sensory: No sensory deficit.  Psychiatric:        Speech: Speech normal.        Behavior: Behavior normal.      ED Treatments / Results  Labs (all labs ordered are listed, but only abnormal results are displayed) Labs Reviewed - No data to display  EKG None  Radiology No results found.  Procedures Procedures (including critical care time)  Medications Ordered in ED Medications - No data to display   Initial Impression / Assessment and Plan / ED Course  I have reviewed the triage vital signs and the nursing notes.  Pertinent labs & imaging results that were available during my care of the patient were reviewed by me and considered in my medical decision making (see chart for details).    Patient likely influenza and will be placed on Tamiflu and return precautions given  Final Clinical Impressions(s) / ED Diagnoses   Final diagnoses:  None    ED Discharge Orders    None       Lorre NickAllen, Shandricka Monroy, MD 02/12/18 1056

## 2019-03-08 DIAGNOSIS — Z20828 Contact with and (suspected) exposure to other viral communicable diseases: Secondary | ICD-10-CM | POA: Diagnosis not present

## 2019-03-08 DIAGNOSIS — Z03818 Encounter for observation for suspected exposure to other biological agents ruled out: Secondary | ICD-10-CM | POA: Diagnosis not present

## 2019-04-22 DIAGNOSIS — Z03818 Encounter for observation for suspected exposure to other biological agents ruled out: Secondary | ICD-10-CM | POA: Diagnosis not present

## 2019-06-08 DIAGNOSIS — Z20828 Contact with and (suspected) exposure to other viral communicable diseases: Secondary | ICD-10-CM | POA: Diagnosis not present

## 2019-06-08 DIAGNOSIS — Z03818 Encounter for observation for suspected exposure to other biological agents ruled out: Secondary | ICD-10-CM | POA: Diagnosis not present

## 2019-07-04 DIAGNOSIS — Z03818 Encounter for observation for suspected exposure to other biological agents ruled out: Secondary | ICD-10-CM | POA: Diagnosis not present

## 2019-07-04 DIAGNOSIS — Z20828 Contact with and (suspected) exposure to other viral communicable diseases: Secondary | ICD-10-CM | POA: Diagnosis not present

## 2019-08-30 IMAGING — US US MFM FETAL NUCHAL TRANSLUCENCY
1 series · 15 of 28 positions shown · non-contrast
Comparison: none

[Series 1: us mfm fetal nuchal translucency · 35 acquisitions, 15 frames shown]
[im 1/35]
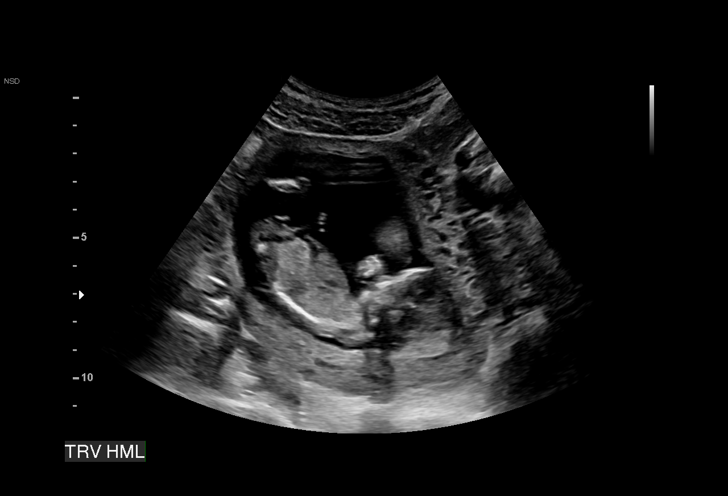
[im 3/35]
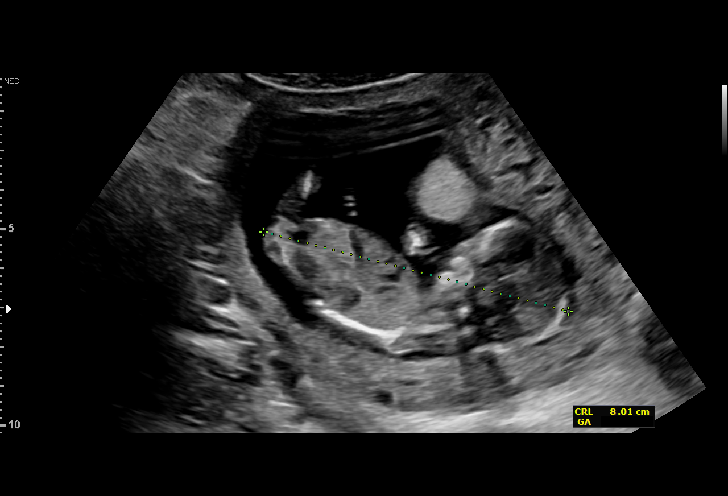
[im 6/35]
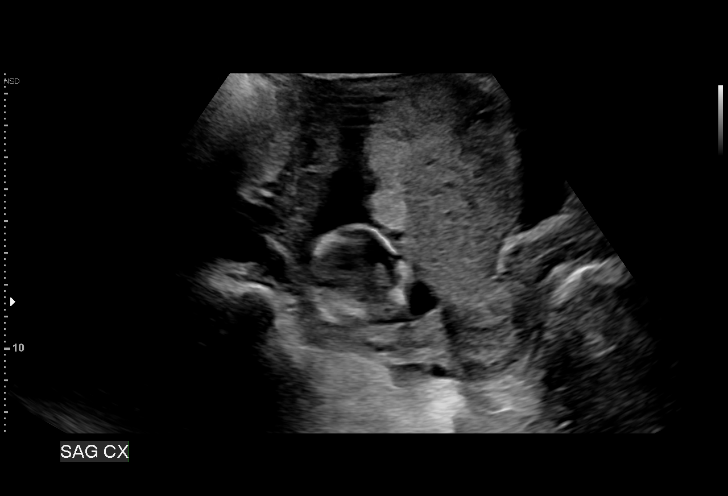
[im 8/35]
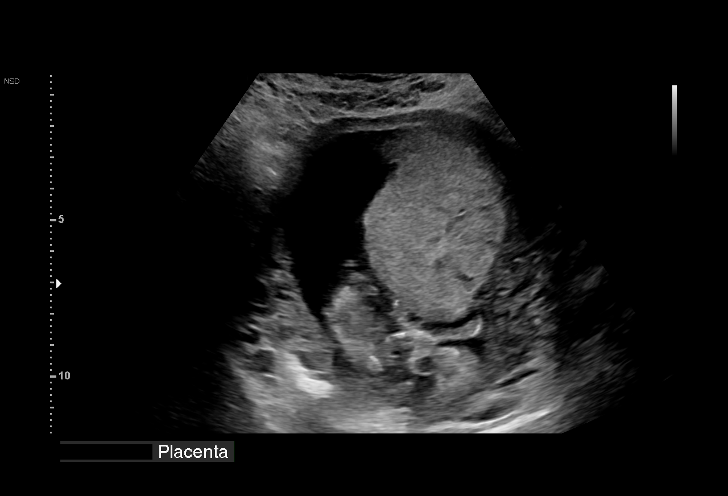
[im 11/35]
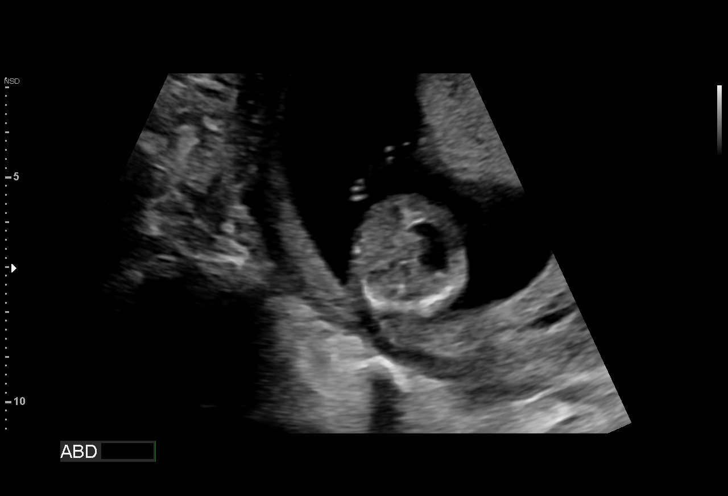
[im 13/35]
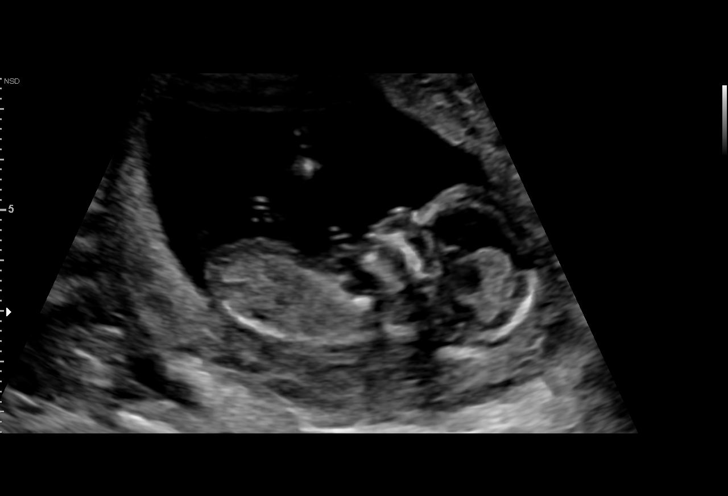
[im 16/35]
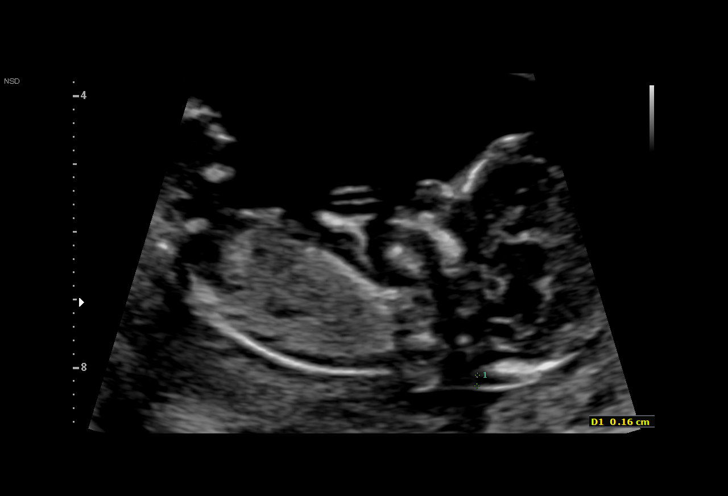
[im 18/35]
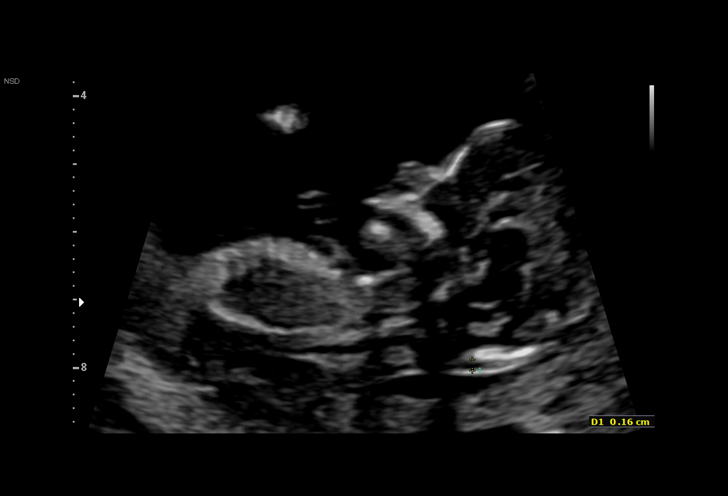
[im 19/35]
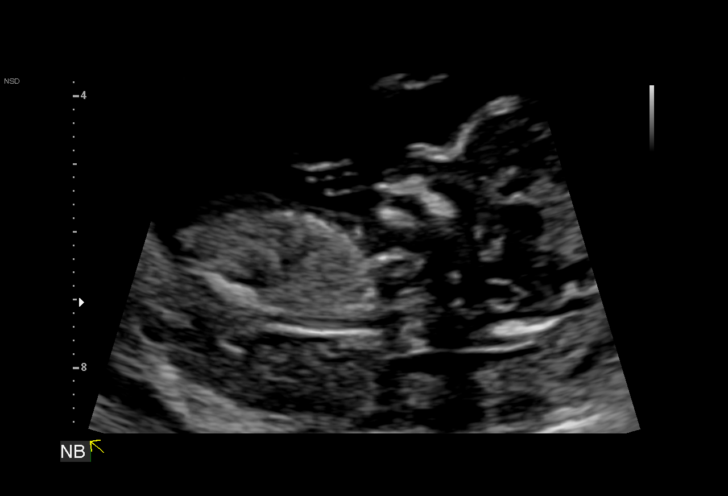
[im 22/35]
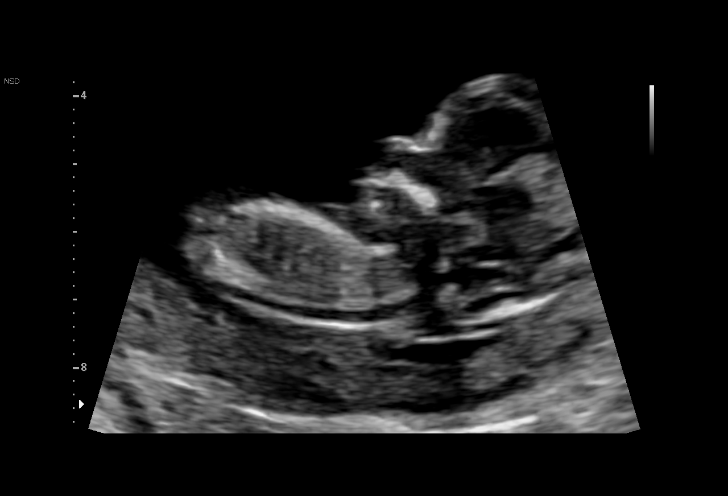
[im 24/35]
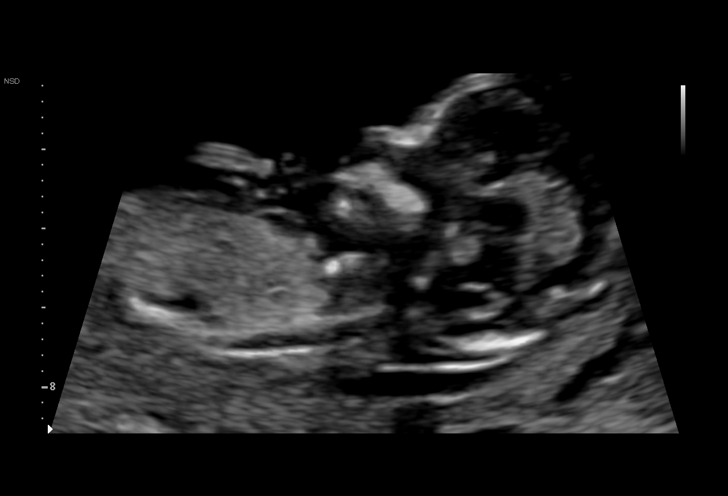
[im 27/35]
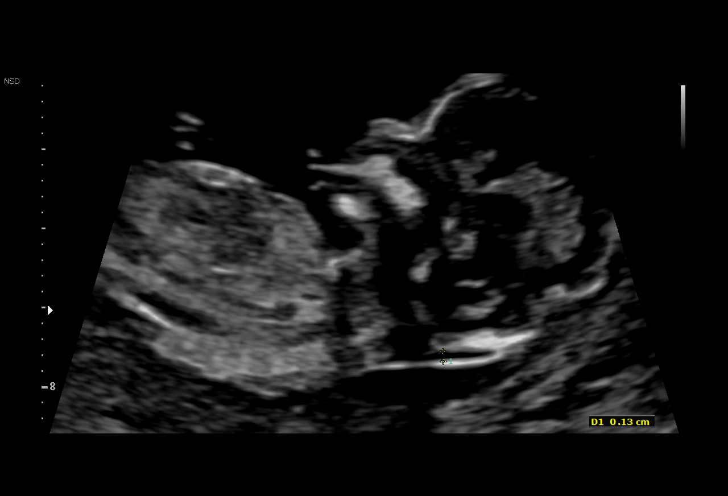
[im 29/35]
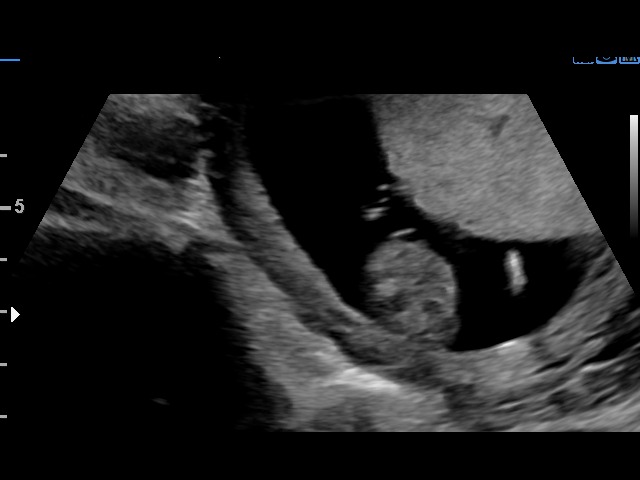
[im 32/35]
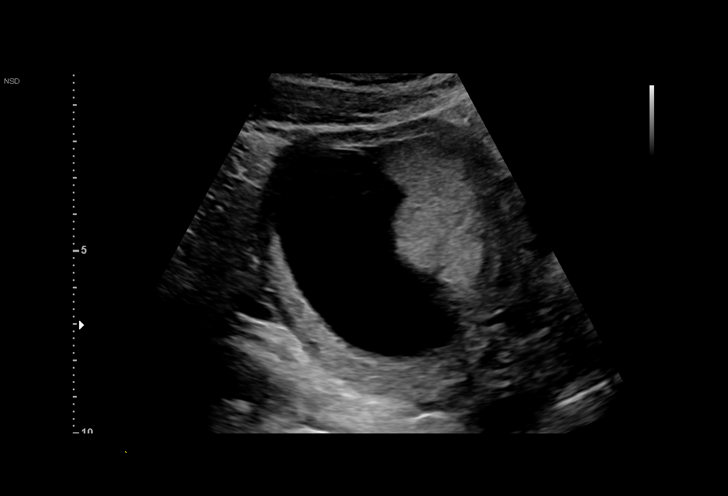
[im 35/35]
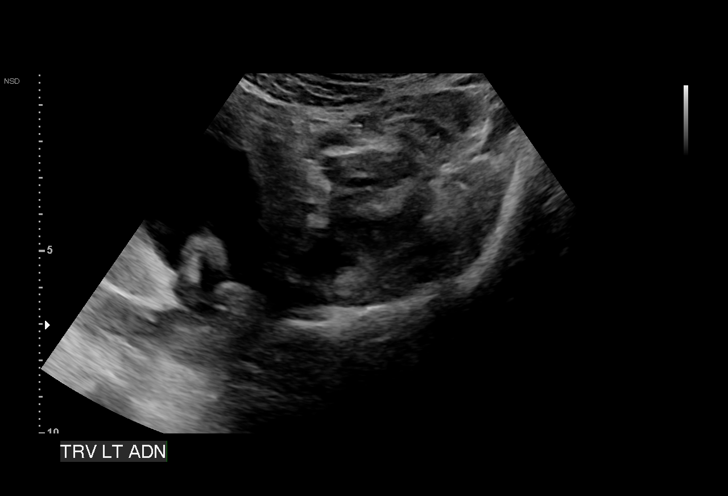

[15 of 28 positions shown; findings below may reference images not displayed]

[REDACTED]-
Faculty Physician

TRANSLUCENCY

1  [facility]IT [facility]AL[facility] HRD              [id number]      [id number]     [id number]
Indications

13 weeks gestation of pregnancy
Encounter for nuchal translucency
OB History

Gravidity:    3         Term:   0        Prem:   0        SAB:   0
TOP:          2       Ectopic:  0        Living: 0
Fetal Evaluation

Num Of Fetuses:     1
Fetal Heart         155
Rate(bpm):
Cardiac Activity:   Observed
Presentation:       Transverse, head to maternal left
Placenta:           Anterior, above cervical os

Amniotic Fluid
AFI FV:      Subjectively within normal limits
Biometry

CRL:        80  mm     G. Age:  13w 5d                  EDD:   07/30/17
Gestational Age

LMP:           13w 2d        Date:  10/26/16                 EDD:   08/02/17
Best:          13w 5d     Det. By:  U/S C R L (01/27/17)     EDD:   07/30/17
1st Trimester Genetic Sonogram Screening

CRL:              80  mm    G. Age:   13w 5d                 EDD:   07/30/17

Nasal Bone:                 Present
Anatomy

Choroid Plexus:        Appears normal         Upper Extremities:      Present
Stomach:               Appears normal, left   Lower Extremities:      Present
sided
Cervix Uterus Adnexa

Cervix
Normal appearance by transabdominal scan.

Left Ovary
Not visualized.

Right Ovary
Within normal limits.

Adnexa:       No abnormality visualized.
Impression

IUP at 13+5 weeks, here for first trimester screening
Normal fetal cardiac activity
Normal fetal morphology, with nasal bone present
CRL confirms menstrual dating
Nuchal translucency measures 1.4 mm
Recommendations

Serum analytes drawn today
Recommend anatomic survey at 18-20 weeks

## 2019-10-08 IMAGING — US US MFM OB DETAIL+14 WK
1 series · 14 of 28 positions shown · non-contrast
Comparison: none

[Series 1: us mfm ob detail+14 wk · 94 acquisitions, 14 frames shown]
[im 4/94]
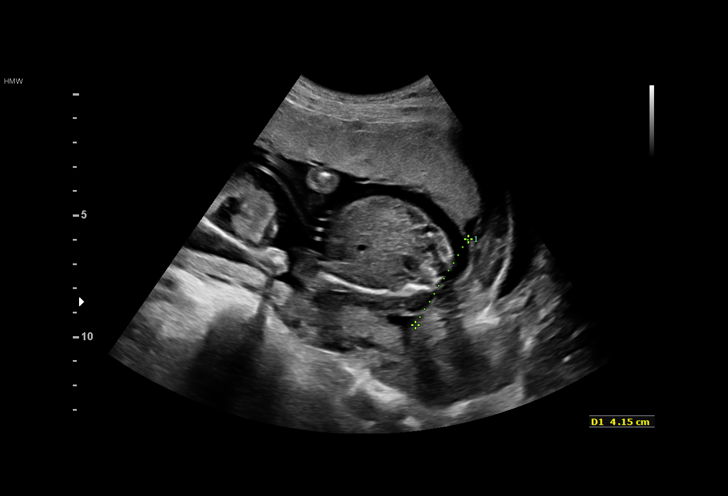
[im 11/94]
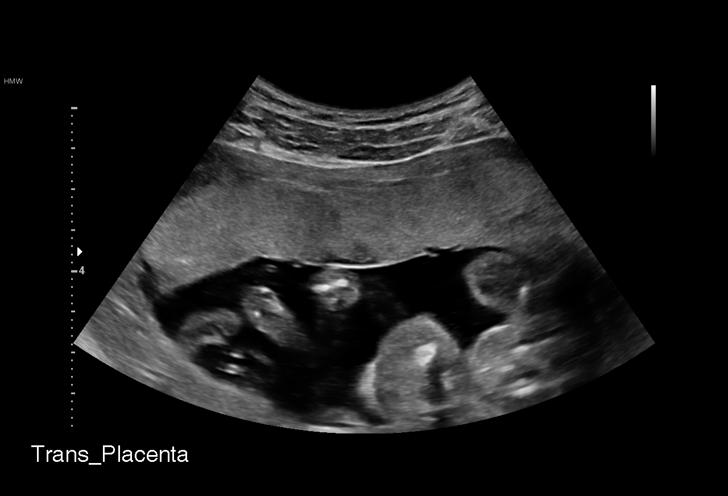
[im 18/94]
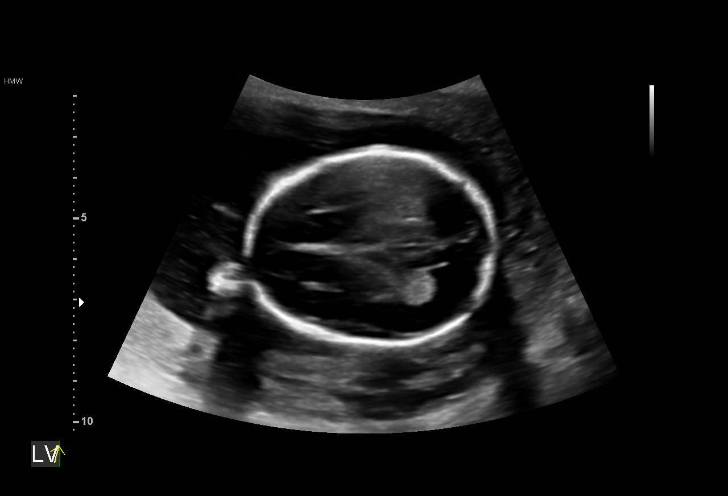
[im 25/94]
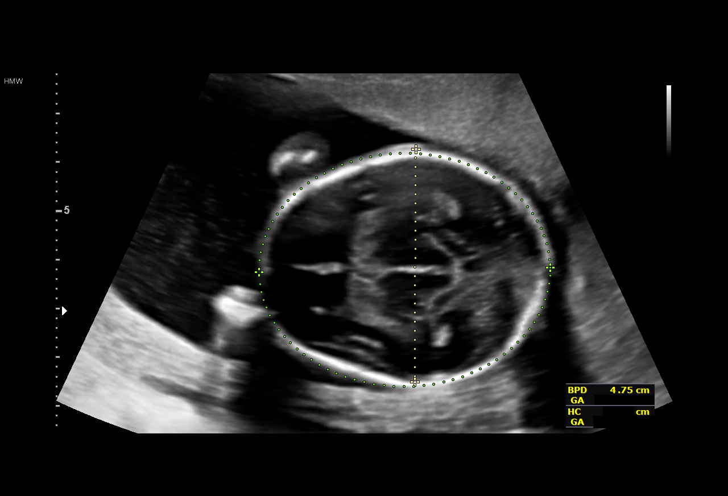
[im 32/94]
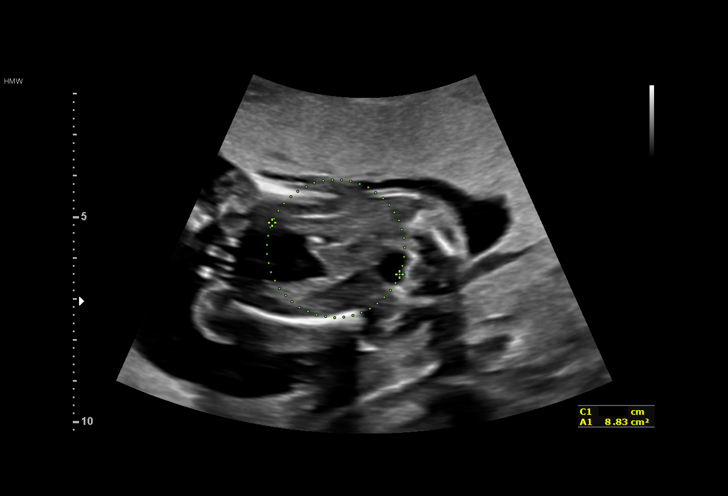
[im 38/94]
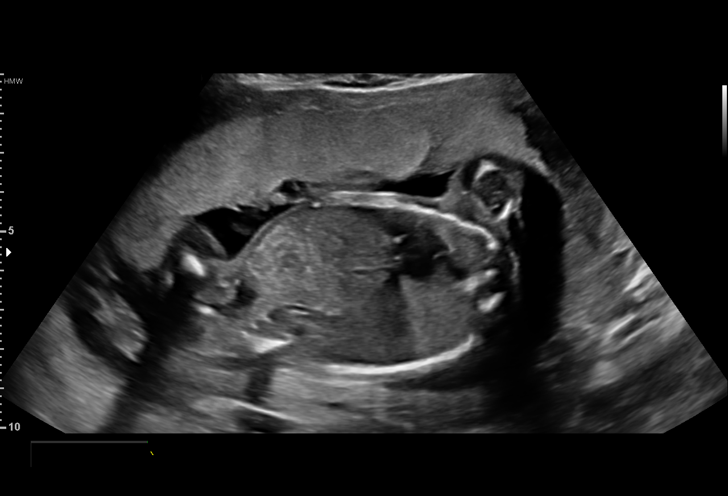
[im 45/94]
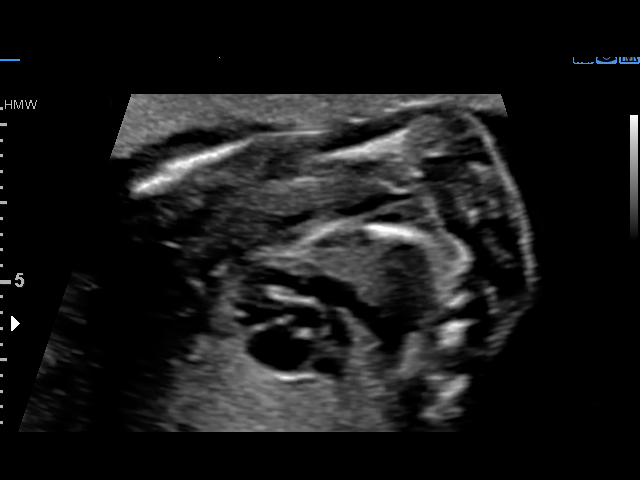
[im 52/94]
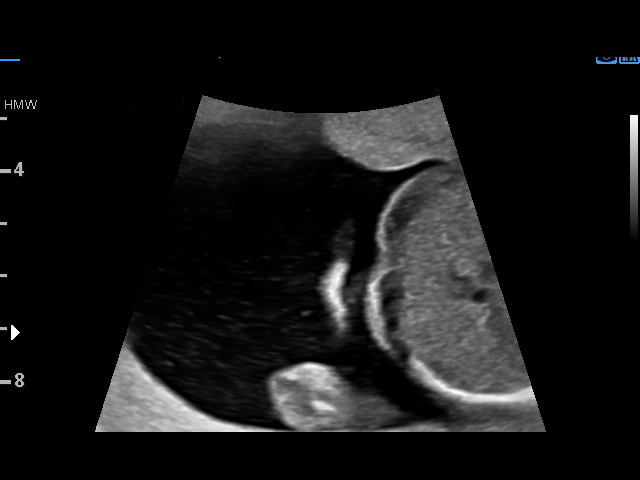
[im 59/94]
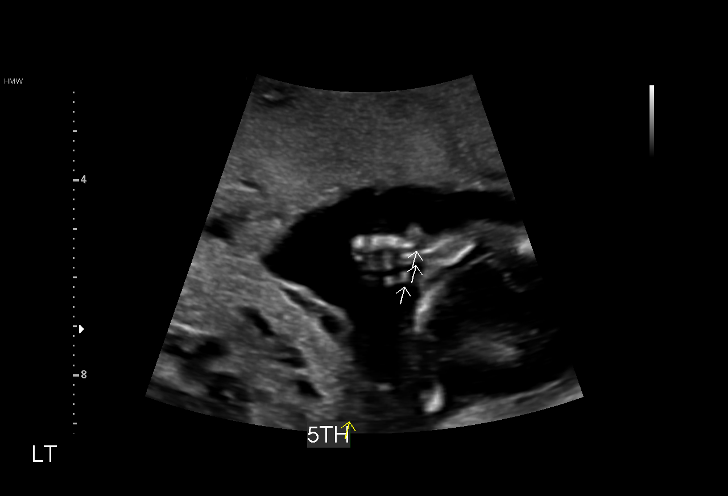
[im 66/94]
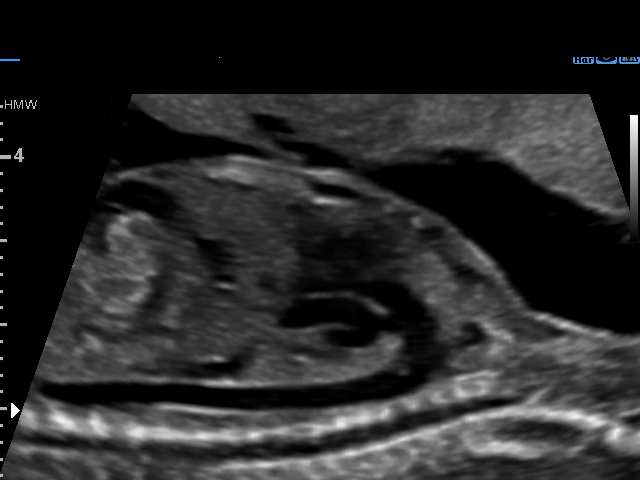
[im 73/94]
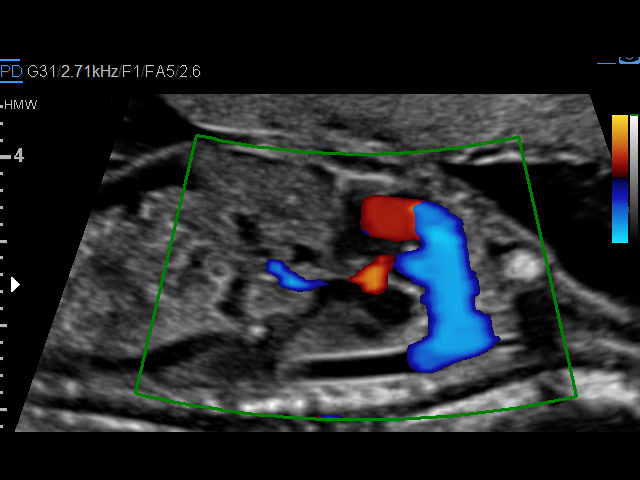
[im 80/94]
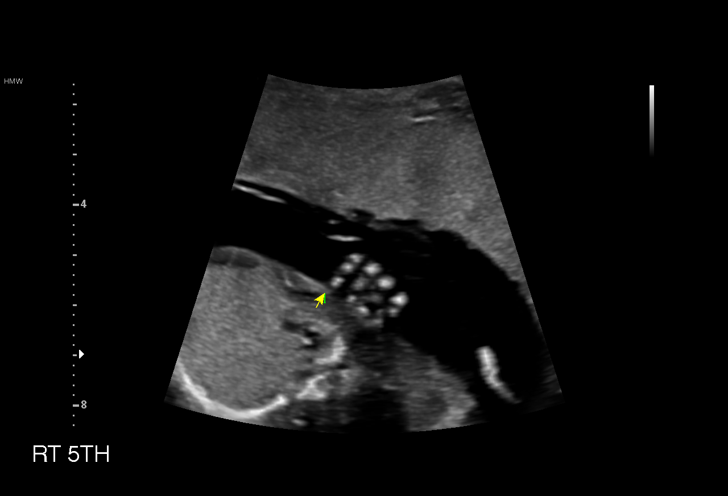
[im 87/94]
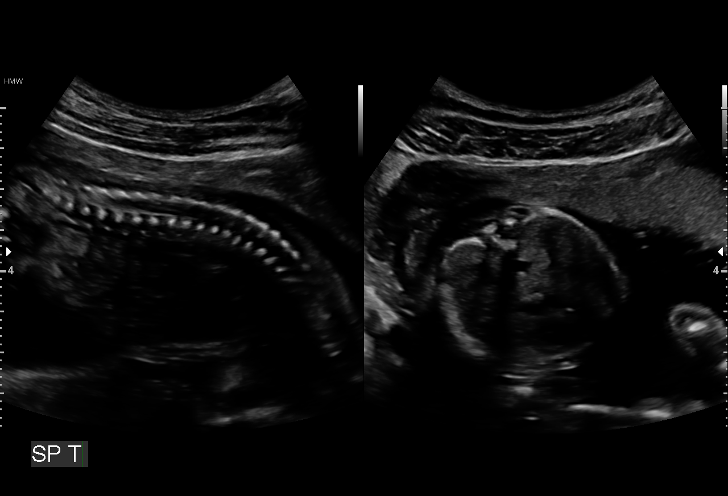
[im 94/94]
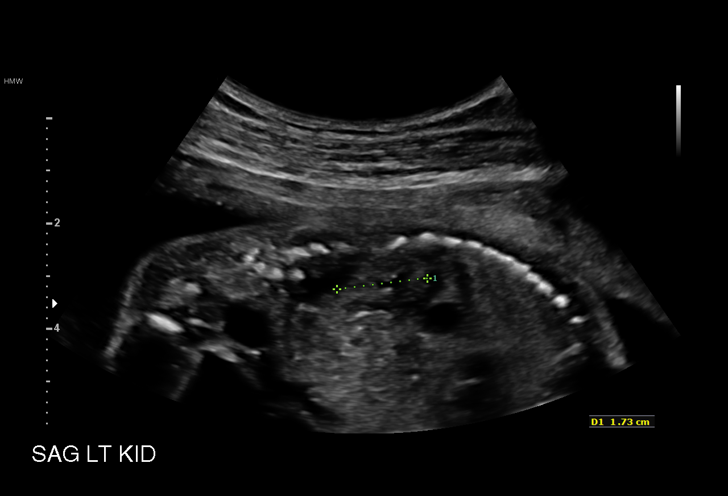

[14 of 28 positions shown; findings below may reference images not displayed]

[REDACTED]-
Faculty Physician

1  STAVROS AIELLO            [id number]      [id number]     [id number]
Indications

19 weeks gestation of pregnancy
Encounter for antenatal screening for
malformations
Maternal alcohol use complicating
pregnancy, antepartum (first trimester)
OB History

Gravidity:    3         Term:   0        Prem:   0        SAB:   0
TOP:          2       Ectopic:  0        Living: 0
Fetal Evaluation

Num Of Fetuses:     1
Fetal Heart         155
Rate(bpm):
Cardiac Activity:   Observed
Presentation:       Breech
Placenta:           Anterior, above cervical os
P. Cord Insertion:  Visualized, central

Amniotic Fluid
AFI FV:      Subjectively within normal limits

Largest Pocket(cm)
5.1
Biometry

BPD:      47.3  mm     G. Age:  20w 2d         87  %    CI:        78.14   %    70 - 86
FL/HC:      18.8   %    16.1 -
HC:      169.3  mm     G. Age:  19w 4d         56  %    HC/AC:      1.13        1.09 -
AC:      150.2  mm     G. Age:  20w 2d         77  %    FL/BPD:     67.2   %
FL:       31.8  mm     G. Age:  19w 6d         65  %    FL/AC:      21.2   %    20 - 24
HUM:        32  mm     G. Age:  20w 5d         89  %

Est. FW:     329  gm    0 lb 12 oz      57  %
Gestational Age

LMP:           18w 6d        Date:  10/26/16                 EDD:   08/02/17
U/S Today:     20w 0d                                        EDD:   07/25/17
Best:          19w 2d     Det. By:  U/S C R L  (01/27/17)    EDD:   07/30/17
Anatomy

Cranium:               Appears normal         Aortic Arch:            Appears normal
Cavum:                 Appears normal         Ductal Arch:            Appears normal
Ventricles:            Appears normal         Diaphragm:              Appears normal
Choroid Plexus:        Appears normal         Stomach:                Appears normal, left
sided
Cerebellum:            Appears normal         Abdomen:                Appears normal
Posterior Fossa:       Appears normal         Abdominal Wall:         Appears nml (cord
insert, abd wall)
Nuchal Fold:           Appears normal         Cord Vessels:           Appears normal (3
vessel cord)
Face:                  Appears normal         Kidneys:                Appear normal
(orbits and profile)
Lips:                  Appears normal         Bladder:                Appears normal
Thoracic:              Appears normal         Spine:                  Appears normal
Heart:                 Echogenic focus        Upper Extremities:      Appears normal
in LV
RVOT:                  Appears normal         Lower Extremities:      Appears normal
LVOT:                  Appears normal

Other:  Parents do not wish to know sex of fetus. Fetus appears to be a male.
Heels and 5th digit visualized. Nasal bone visualized. Technically
difficult due to fetal position.
Cervix Uterus Adnexa

Cervix
Length:            3.4  cm.
Normal appearance by transabdominal scan.

Uterus
No abnormality visualized.

Left Ovary
No adnexal mass visualized.

Right Ovary
No adnexal mass visualized.

Cul De Sac:   No free fluid seen.

Adnexa:       No abnormality visualized.
Impression

Singleton intrauterine pregnancy at 19+2 weeks with materal
alcohol consumption, here for anatomic survey
Review of the anatomy shows no sonographic markers for
aneuploidy or structural anomalies; there is a nonpathologic
echogenic focus in the mitral complex of the left ventricle
All relevant anatomy has been visualized
Amniotic fluid volume is normal
Estimated fetal weight shows growth in the 57th percentile
Recommendations

Follow-up ultrasounds as clinically indicated.

## 2019-10-24 DIAGNOSIS — Z03818 Encounter for observation for suspected exposure to other biological agents ruled out: Secondary | ICD-10-CM | POA: Diagnosis not present

## 2019-10-24 DIAGNOSIS — Z20822 Contact with and (suspected) exposure to covid-19: Secondary | ICD-10-CM | POA: Diagnosis not present

## 2019-11-28 DIAGNOSIS — N760 Acute vaginitis: Secondary | ICD-10-CM | POA: Diagnosis not present

## 2019-11-28 DIAGNOSIS — Z30431 Encounter for routine checking of intrauterine contraceptive device: Secondary | ICD-10-CM | POA: Diagnosis not present

## 2019-11-28 DIAGNOSIS — Z01419 Encounter for gynecological examination (general) (routine) without abnormal findings: Secondary | ICD-10-CM | POA: Diagnosis not present

## 2020-02-26 ENCOUNTER — Encounter (HOSPITAL_COMMUNITY): Payer: Self-pay

## 2020-02-26 ENCOUNTER — Other Ambulatory Visit: Payer: Self-pay

## 2020-02-26 DIAGNOSIS — J069 Acute upper respiratory infection, unspecified: Secondary | ICD-10-CM | POA: Diagnosis not present

## 2020-02-26 DIAGNOSIS — U071 COVID-19: Secondary | ICD-10-CM | POA: Diagnosis not present

## 2020-02-26 LAB — RESP PANEL BY RT-PCR (FLU A&B, COVID) ARPGX2
Influenza A by PCR: NEGATIVE
Influenza B by PCR: NEGATIVE
SARS Coronavirus 2 by RT PCR: POSITIVE — AB

## 2020-02-26 NOTE — ED Triage Notes (Signed)
Pt reports a student at school has been sick. Sx of runny nose, cough, body aches, and chills for 1 day.

## 2020-02-27 ENCOUNTER — Emergency Department (HOSPITAL_COMMUNITY)
Admission: EM | Admit: 2020-02-27 | Discharge: 2020-02-27 | Disposition: A | Discharge status: Home / Self Care | Payer: BC Managed Care – PPO | Attending: Emergency Medicine | Admitting: Emergency Medicine

## 2020-02-27 DIAGNOSIS — U071 COVID-19: Secondary | ICD-10-CM

## 2020-02-27 MED ORDER — ONDANSETRON 4 MG PO TBDP: 4.0000 mg | ORAL_TABLET | Freq: Three times a day (TID) | ORAL | 0 refills | Status: AC | PRN

## 2020-02-27 NOTE — ED Provider Notes (Signed)
[facility]-EMERGENCY DEPT Provider Note   CSN: [id number] Arrival date & time: 02/26/20  2211     History Chief Complaint  Patient presents with  . Generalized Body Aches    Denise Abbott is a 30 y.o. female with no significant past medical history who presents to the emergency department with a chief complaint of URI symptoms.  The patient endorses rhinorrhea, nonproductive cough, chills, and myalgias that began 24 hours ago.  She denies shortness of breath, chest pain, vomiting, abdominal pain, loss of sense of taste or smell, headache, neck pain or stiffness, dysuria, vaginal discharge, or rash.  She is fully vaccinated against COVID-19, but she has not received a booster.  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  The history is provided by the patient and medical records. No language interpreter was used.       Past Medical History:  Diagnosis Date  . Medical history non-contributory     Patient Active Problem List   Diagnosis Date Noted  . SVD (spontaneous vaginal delivery) 07/25/2017  . Amniotic fluid leaking 07/21/2017    Past Surgical History:  Procedure Laterality Date  . NO PAST SURGERIES       OB History    Gravida  3   Para  1   Term  1   Preterm      AB  2   Living  1     SAB      IAB  2   Ectopic      Multiple  0   Live Births  1           Family History  Problem Relation Age of Onset  . Healthy Mother   . Healthy Father     Social History   Tobacco Use  . Smoking status: Never Smoker  . Smokeless tobacco: Never Used  Vaping Use  . Vaping Use: Never used  Substance Use Topics  . Alcohol use: No  . Drug use: No    Home Medications Prior to Admission medications   Medication Sig Start Date End Date Taking? Authorizing Provider  ondansetron (ZOFRAN ODT) 4 MG disintegrating tablet Take 1 tablet (4 mg total) by mouth every 8 (eight) hours as  needed. 02/27/20  Yes Macaila Tahir A, PA-C  acetaminophen (TYLENOL) 500 MG tablet Take 500 mg by mouth every 6 (six) hours as needed for headache.    [provider]  chlorhexidine (PERIDEX) 0.12 % solution Use as directed 15 mLs in the mouth or throat 2 (two) times daily.  06/26/17   [provider]  HYDROcodone-acetaminophen (HYCET) 7.5-325 mg/15 ml solution Take 15 mLs by mouth every 6 (six) hours as needed for moderate pain. 12/28/17   Benjiman Core, MD  ibuprofen (ADVIL,MOTRIN) 600 MG tablet Take 1 tablet (600 mg total) by mouth every 6 (six) hours. 07/25/17   Degele, Kandra Nicolas, MD  oseltamivir (TAMIFLU) 75 MG capsule Take 1 capsule (75 mg total) by mouth every 12 (twelve) hours. 02/12/18   Lorre Nick, MD  Prenatal Vit-Fe Fumarate-FA (PRENATAL MULTIVITAMIN) TABS tablet Take 1 tablet by mouth daily at 12 noon.    [provider]    Allergies    Patient has no known allergies.  Review of Systems   Review of Systems  Constitutional: Positive for chills. Negative for activity change, diaphoresis, fatigue and fever.  HENT: Positive for congestion and rhinorrhea. Negative for  nosebleeds and sore throat.   Eyes: Negative for visual disturbance.  Respiratory: Positive for cough. Negative for shortness of breath and wheezing.   Cardiovascular: Negative for chest pain.  Gastrointestinal: Negative for abdominal pain, blood in stool, constipation, nausea and vomiting.  Genitourinary: Negative for dysuria and vaginal discharge.  Musculoskeletal: Positive for myalgias. Negative for back pain and gait problem.  Skin: Negative for rash.  Allergic/Immunologic: Negative for immunocompromised state.  Neurological: Negative for seizures, syncope, weakness, numbness and headaches.  Psychiatric/Behavioral: Negative for confusion.    Physical Exam Updated Vital Signs BP 140/72   Pulse 70   Temp 98.4 F (36.9 C)   Resp 16   SpO2 98%   Physical Exam Vitals and nursing  note reviewed.  Constitutional:      General: She is not in acute distress.    Appearance: She is not ill-appearing, toxic-appearing or diaphoretic.     Comments: Well-appearing.  No acute distress.  HENT:     Head: Normocephalic.     Right Ear: Tympanic membrane, ear canal and external ear normal.     Left Ear: Tympanic membrane, ear canal and external ear normal.     Nose: Rhinorrhea present.     Mouth/Throat:     Mouth: Mucous membranes are moist.     Pharynx: Posterior oropharyngeal erythema present. No oropharyngeal exudate.  Eyes:     Conjunctiva/sclera: Conjunctivae normal.  Cardiovascular:     Rate and Rhythm: Normal rate and regular rhythm.     Heart sounds: No murmur heard. No friction rub. No gallop.   Pulmonary:     Effort: Pulmonary effort is normal. No respiratory distress.     Breath sounds: No stridor. No wheezing, rhonchi or rales.  Chest:     Chest wall: No tenderness.  Abdominal:     General: There is no distension.     Palpations: Abdomen is soft. There is no mass.     Tenderness: There is no abdominal tenderness. There is no right CVA tenderness, left CVA tenderness, guarding or rebound.     Hernia: No hernia is present.  Musculoskeletal:     Cervical back: Neck supple.  Skin:    General: Skin is warm.     Findings: No rash.  Neurological:     Mental Status: She is alert.  Psychiatric:        Behavior: Behavior normal.     ED Results / Procedures / Treatments   Labs (all labs ordered are listed, but only abnormal results are displayed) Labs Reviewed  RESP PANEL BY RT-PCR (FLU A&B, COVID) ARPGX2 - Abnormal; Notable for the following components:      Result Value   SARS Coronavirus 2 by RT PCR POSITIVE (*)    All other components within normal limits    EKG None  Radiology No results found.  Procedures Procedures (including critical care time)  Medications Ordered in ED Medications - No data to display  ED Course  I have reviewed the  triage vital signs and the nursing notes.  Pertinent labs & imaging results that were available during my care of the patient were reviewed by me and considered in my medical decision making (see chart for details).    MDM Rules/Calculators/A&P                          30 year old female with no significant past medical history who has been fully vaccinated against COVID-19, but has not  received her booster injection presents with 24 hours of URI symptoms.  She has no chest pain or shortness of breath.  No vomiting or abdominal pain.  On exam, she is nontoxic-appearing.  Physical exam is reassuring.  Labs have been reviewed and independently interpreted by me.  She tested positive for COVID-19.  Test results were discussed.  We also discussed minimizing symptoms at home since she has a 72-year-old who is not able to be vaccinated.  However, she does report that her child has had 24 hours of coughing.  Recommended that he also seek testing before returning to daycare.  All questions answered regarding quarantining.  We will discharge the patient home with Zofran for symptomatic treatment.  She has also been counseled on symptom management at home and the length of quarantine. She is hemodynamically stable and in no acute distress.  Safer discharge home with outpatient follow-up as needed.  Final Clinical Impression(s) / ED Diagnoses Final diagnoses:  [id number]    Rx / DC Orders ED Discharge Orders         Ordered    ondansetron (ZOFRAN ODT) 4 MG disintegrating tablet  Every 8 hours PRN        02/27/20 0500           Elbert Polyakov A, PA-C 02/27/20 0529    Fatima Blank, MD 02/27/20 1844

## 2020-02-27 NOTE — Discharge Instructions (Addendum)
Thank you for allowing me to care for you today in the Emergency Department.   Unfortunately, you tested positive today for COVID-19.  You need to quarantine at home for a total of 10 days from when your symptoms began.  Since your son started coughing yesterday, I would recommend that he get tested before he returns to daycare since she tested positive today.  You can call his pediatrician or review the complete side of COVID-19 testing locations on [place]'s website.  If other family members are asymptomatic, you can try to prevent the spread of infection by social distancing in your home, sleeping in another room, wearing a mask when you are around other family members and also having the mask up if they are old enough, and wash your hands frequently with warm water and soap.  Take 650 mg of Tylenol or 600 mg of ibuprofen with food every 6 hours for pain or fever.  You can alternate between these 2 medications every 3 hours if your pain returns.  For instance, you can take Tylenol at noon, followed by a dose of ibuprofen at 3, followed by second dose of Tylenol and 6.  Let 1 tablet of Zofran dissolve under tongue every 8 hours as needed for nausea or vomiting.  Return to the emergency department if you develop respiratory distress, if you become very sleepy and difficult to wake up, if you have uncontrollable vomiting or diarrhea, stop producing urine, or develop other new, concerning symptoms.

## 2020-03-05 DIAGNOSIS — Z20822 Contact with and (suspected) exposure to covid-19: Secondary | ICD-10-CM | POA: Diagnosis not present

## 2020-03-05 DIAGNOSIS — Z03818 Encounter for observation for suspected exposure to other biological agents ruled out: Secondary | ICD-10-CM | POA: Diagnosis not present

## 2020-03-27 DIAGNOSIS — Z20822 Contact with and (suspected) exposure to covid-19: Secondary | ICD-10-CM | POA: Diagnosis not present

## 2020-03-27 DIAGNOSIS — Z03818 Encounter for observation for suspected exposure to other biological agents ruled out: Secondary | ICD-10-CM | POA: Diagnosis not present

## 2020-04-13 DIAGNOSIS — Z03818 Encounter for observation for suspected exposure to other biological agents ruled out: Secondary | ICD-10-CM | POA: Diagnosis not present

## 2020-04-13 DIAGNOSIS — Z20822 Contact with and (suspected) exposure to covid-19: Secondary | ICD-10-CM | POA: Diagnosis not present

## 2020-04-27 DIAGNOSIS — N760 Acute vaginitis: Secondary | ICD-10-CM | POA: Diagnosis not present

## 2020-04-27 DIAGNOSIS — Z114 Encounter for screening for human immunodeficiency virus [HIV]: Secondary | ICD-10-CM | POA: Diagnosis not present

## 2020-04-27 DIAGNOSIS — Z113 Encounter for screening for infections with a predominantly sexual mode of transmission: Secondary | ICD-10-CM | POA: Diagnosis not present

## 2020-07-13 DIAGNOSIS — Z9109 Other allergy status, other than to drugs and biological substances: Secondary | ICD-10-CM | POA: Diagnosis not present

## 2020-07-13 DIAGNOSIS — Z8742 Personal history of other diseases of the female genital tract: Secondary | ICD-10-CM | POA: Diagnosis not present

## 2020-09-14 DIAGNOSIS — Z20822 Contact with and (suspected) exposure to covid-19: Secondary | ICD-10-CM | POA: Diagnosis not present

## 2020-09-14 DIAGNOSIS — Z03818 Encounter for observation for suspected exposure to other biological agents ruled out: Secondary | ICD-10-CM | POA: Diagnosis not present

## 2021-01-04 DIAGNOSIS — H66003 Acute suppurative otitis media without spontaneous rupture of ear drum, bilateral: Secondary | ICD-10-CM | POA: Diagnosis not present

## 2021-07-29 DIAGNOSIS — R946 Abnormal results of thyroid function studies: Secondary | ICD-10-CM | POA: Diagnosis not present

## 2021-07-29 DIAGNOSIS — Z8742 Personal history of other diseases of the female genital tract: Secondary | ICD-10-CM | POA: Diagnosis not present

## 2021-07-29 DIAGNOSIS — R7309 Other abnormal glucose: Secondary | ICD-10-CM | POA: Diagnosis not present

## 2021-07-29 DIAGNOSIS — Z841 Family history of disorders of kidney and ureter: Secondary | ICD-10-CM | POA: Diagnosis not present

## 2021-08-05 DIAGNOSIS — R7309 Other abnormal glucose: Secondary | ICD-10-CM | POA: Diagnosis not present

## 2021-08-05 DIAGNOSIS — R946 Abnormal results of thyroid function studies: Secondary | ICD-10-CM | POA: Diagnosis not present

## 2021-08-05 DIAGNOSIS — Z0000 Encounter for general adult medical examination without abnormal findings: Secondary | ICD-10-CM | POA: Diagnosis not present

## 2021-08-05 DIAGNOSIS — Z13220 Encounter for screening for lipoid disorders: Secondary | ICD-10-CM | POA: Diagnosis not present

## 2021-08-13 DIAGNOSIS — Z1389 Encounter for screening for other disorder: Secondary | ICD-10-CM | POA: Diagnosis not present

## 2021-08-13 DIAGNOSIS — Z130 Encounter for screening for diseases of the blood and blood-forming organs and certain disorders involving the immune mechanism: Secondary | ICD-10-CM | POA: Diagnosis not present

## 2021-08-13 DIAGNOSIS — Z01419 Encounter for gynecological examination (general) (routine) without abnormal findings: Secondary | ICD-10-CM | POA: Diagnosis not present

## 2021-08-13 DIAGNOSIS — Z113 Encounter for screening for infections with a predominantly sexual mode of transmission: Secondary | ICD-10-CM | POA: Diagnosis not present

## 2021-09-17 DIAGNOSIS — Z03818 Encounter for observation for suspected exposure to other biological agents ruled out: Secondary | ICD-10-CM | POA: Diagnosis not present

## 2021-09-17 DIAGNOSIS — R051 Acute cough: Secondary | ICD-10-CM | POA: Diagnosis not present

## 2021-09-17 DIAGNOSIS — Z681 Body mass index (BMI) 19 or less, adult: Secondary | ICD-10-CM | POA: Diagnosis not present

## 2021-10-03 DIAGNOSIS — R059 Cough, unspecified: Secondary | ICD-10-CM | POA: Diagnosis not present

## 2021-10-03 DIAGNOSIS — R0789 Other chest pain: Secondary | ICD-10-CM | POA: Diagnosis not present

## 2021-10-03 DIAGNOSIS — R051 Acute cough: Secondary | ICD-10-CM | POA: Diagnosis not present

## 2021-10-03 DIAGNOSIS — R519 Headache, unspecified: Secondary | ICD-10-CM | POA: Diagnosis not present

## 2021-10-12 DIAGNOSIS — F332 Major depressive disorder, recurrent severe without psychotic features: Secondary | ICD-10-CM | POA: Diagnosis not present

## 2022-08-05 DIAGNOSIS — R7309 Other abnormal glucose: Secondary | ICD-10-CM | POA: Diagnosis not present

## 2022-08-05 DIAGNOSIS — Z0000 Encounter for general adult medical examination without abnormal findings: Secondary | ICD-10-CM | POA: Diagnosis not present

## 2022-08-05 DIAGNOSIS — Z13220 Encounter for screening for lipoid disorders: Secondary | ICD-10-CM | POA: Diagnosis not present

## 2022-08-05 DIAGNOSIS — R946 Abnormal results of thyroid function studies: Secondary | ICD-10-CM | POA: Diagnosis not present

## 2022-08-18 DIAGNOSIS — Z1389 Encounter for screening for other disorder: Secondary | ICD-10-CM | POA: Diagnosis not present

## 2022-08-18 DIAGNOSIS — Z130 Encounter for screening for diseases of the blood and blood-forming organs and certain disorders involving the immune mechanism: Secondary | ICD-10-CM | POA: Diagnosis not present

## 2022-08-18 DIAGNOSIS — Z202 Contact with and (suspected) exposure to infections with a predominantly sexual mode of transmission: Secondary | ICD-10-CM | POA: Diagnosis not present

## 2022-08-18 DIAGNOSIS — Z01411 Encounter for gynecological examination (general) (routine) with abnormal findings: Secondary | ICD-10-CM | POA: Diagnosis not present

## 2022-08-18 DIAGNOSIS — N898 Other specified noninflammatory disorders of vagina: Secondary | ICD-10-CM | POA: Diagnosis not present

## 2022-08-31 DIAGNOSIS — Z0000 Encounter for general adult medical examination without abnormal findings: Secondary | ICD-10-CM | POA: Diagnosis not present

## 2022-10-02 ENCOUNTER — Other Ambulatory Visit: Payer: Self-pay

## 2022-10-02 ENCOUNTER — Emergency Department (HOSPITAL_COMMUNITY)
Admission: EM | Admit: 2022-10-02 | Discharge: 2022-10-02 | Disposition: A | Discharge status: Home / Self Care | Payer: BC Managed Care – PPO | Attending: Emergency Medicine | Admitting: Emergency Medicine

## 2022-10-02 ENCOUNTER — Encounter (HOSPITAL_COMMUNITY): Payer: Self-pay

## 2022-10-02 DIAGNOSIS — U071 COVID-19: Secondary | ICD-10-CM | POA: Diagnosis not present

## 2022-10-02 DIAGNOSIS — R059 Cough, unspecified: Secondary | ICD-10-CM | POA: Diagnosis not present

## 2022-10-02 LAB — RESP PANEL BY RT-PCR (RSV, FLU A&B, COVID)  RVPGX2
Influenza A by PCR: NEGATIVE
Influenza B by PCR: NEGATIVE
Resp Syncytial Virus by PCR: NEGATIVE
SARS Coronavirus 2 by RT PCR: POSITIVE — AB

## 2022-10-02 MED ORDER — ACETAMINOPHEN 325 MG PO TABS
650.0000 mg | ORAL_TABLET | Freq: Once | ORAL | Status: AC
  Administered 2022-10-02: 650 mg via ORAL
  Filled 2022-10-02: qty 2

## 2022-10-02 MED ORDER — NAPROXEN 500 MG PO TABS
500.0000 mg | ORAL_TABLET | Freq: Once | ORAL | Status: AC
  Administered 2022-10-02: 500 mg via ORAL
  Filled 2022-10-02: qty 1

## 2022-10-02 NOTE — Discharge Instructions (Addendum)
You have tested positive for COVID. Take tylenol for fever and ibuprofen or Aleve for headaches, body aches. Drink plenty of fluids to prevent dehydration. You may continue to use other over-the-counter remedies for symptom control, if desired. Return for new or concerning symptoms such as worsening shortness of breath, coughing up blood, persistent vomiting, loss of consciousness.

## 2022-10-02 NOTE — ED Triage Notes (Signed)
Pt presents via POV c/o cough, sneezing, scratchy throat, and generalize body aches since last night.

## 2022-10-02 NOTE — ED Provider Notes (Signed)
[place] EMERGENCY DEPARTMENT AT [facility] Provider Note   CSN: [id number] Arrival date & time: 10/02/22  2102     History  Chief Complaint  Patient presents with   Generalized Body Aches    Denise Abbott is a 32 y.o. female.  32 year old female presents to the emergency department for evaluation of upper respiratory, flulike symptoms which began last night.  Symptoms have been constant and unchanged.  She has tried TheraFlu without relief.  Complains primarily of generalized bodyaches.  She has had a mild cough as well as some sneezing and scratchy throat.  No inability to swallow, drooling, vomiting.  No known sick contacts.  The history is provided by the patient. No language interpreter was used.       Home Medications Prior to Admission medications   Medication Sig Start Date End Date Taking? Authorizing Provider  acetaminophen (TYLENOL) 500 MG tablet Take 500 mg by mouth every 6 (six) hours as needed for headache.    [provider]  chlorhexidine (PERIDEX) 0.12 % solution Use as directed 15 mLs in the mouth or throat 2 (two) times daily.  06/26/17   [provider]  HYDROcodone-acetaminophen (HYCET) 7.5-325 mg/15 ml solution Take 15 mLs by mouth every 6 (six) hours as needed for moderate pain. 12/28/17   Benjiman Core, MD  ibuprofen (ADVIL,MOTRIN) 600 MG tablet Take 1 tablet (600 mg total) by mouth every 6 (six) hours. 07/25/17   Degele, Kandra Nicolas, MD  ondansetron (ZOFRAN ODT) 4 MG disintegrating tablet Take 1 tablet (4 mg total) by mouth every 8 (eight) hours as needed. 02/27/20   McDonald, Mia A, PA-C  oseltamivir (TAMIFLU) 75 MG capsule Take 1 capsule (75 mg total) by mouth every 12 (twelve) hours. 02/12/18   Lorre Nick, MD  Prenatal Vit-Fe Fumarate-FA (PRENATAL MULTIVITAMIN) TABS tablet Take 1 tablet by mouth daily at 12 noon.    [provider]      Allergies    Patient has no known allergies.    Review of Systems   Review  of Systems Ten systems reviewed and are negative for acute change, except as noted in the HPI.    Physical Exam Updated Vital Signs BP 125/83 (BP Location: Right Arm)   Pulse 69   Temp 100.2 F (37.9 C) (Oral)   Resp 17   Ht 5\' 9"  (1.753 m)   Wt 63.5 kg   SpO2 100%   BMI 20.67 kg/m   Physical Exam Vitals and nursing note reviewed.  Constitutional:      General: She is not in acute distress.    Appearance: She is well-developed. She is not diaphoretic.     Comments: Nontoxic appearing and in NAD  HENT:     Head: Normocephalic and atraumatic.     Mouth/Throat:     Comments: Normal phonation. No tripoding or stridor. Tolerating secretions w/o difficulty. Eyes:     General: No scleral icterus.    Extraocular Movements: EOM normal.     Conjunctiva/sclera: Conjunctivae normal.  Cardiovascular:     Rate and Rhythm: Normal rate and regular rhythm.     Pulses: Normal pulses.  Pulmonary:     Effort: Pulmonary effort is normal. No respiratory distress.     Breath sounds: No stridor. No wheezing or rales.     Comments: Respirations even and unlabored. Lungs CTAB. Musculoskeletal:        General: Normal range of motion.     Cervical back: Normal range of motion.  Skin:    General: Skin is warm and dry.     Coloration: Skin is not pale.     Findings: No erythema or rash.  Neurological:     Mental Status: She is alert and oriented to person, place, and time.     Coordination: Coordination normal.  Psychiatric:        Mood and Affect: Mood and affect normal.        Behavior: Behavior normal.     ED Results / Procedures / Treatments   Labs (all labs ordered are listed, but only abnormal results are displayed) Labs Reviewed  RESP PANEL BY RT-PCR (RSV, FLU A&B, COVID)  RVPGX2 - Abnormal; Notable for the following components:      Result Value   SARS Coronavirus 2 by RT PCR POSITIVE (*)    All other components within normal limits    EKG None  Radiology No results  found.  Procedures Procedures    Medications Ordered in ED Medications  naproxen (NAPROSYN) tablet 500 mg (has no administration in time range)  acetaminophen (TYLENOL) tablet 650 mg (has no administration in time range)    ED Course/ Medical Decision Making/ A&P                                 Medical Decision Making Risk OTC drugs. Prescription drug management.   This patient presents to the ED for concern of flu-like symptoms, this involves an extensive number of treatment options, and is a complaint that carries with it a high risk of complications and morbidity.  The differential diagnosis includes COVD vs influenza vs PNA    Co morbidities that complicate the patient evaluation  None    Lab Tests:  I Ordered, and personally interpreted labs.  The pertinent results include:  COVID positive.   Cardiac Monitoring:  The patient was maintained on a cardiac monitor.  I personally viewed and interpreted the cardiac monitored which showed an underlying rhythm of: NSR   Medicines ordered and prescription drug management:  I ordered medication including Naproxen for myalgias  I have reviewed the patients home medicines and have made adjustments as needed   Test Considered:  CXR   Problem List / ED Course:  COVID-positive with flulike symptoms onset yesterday.  Low-grade temperature in the emergency department without criteria for SIRS/sepsis.  No signs of respiratory distress.  Lungs clear to auscultation.  She has no hypoxemia.  Counseled on outpatient supportive management.   Reevaluation:  After the interventions noted above, I reevaluated the patient and found that they have :stayed the same   Dispostion:  After consideration of the diagnostic results and the patients response to treatment, I feel that the patent would benefit from supportive care with antipyretics, OTC medications. Encouraged rest, good PO fluid intake. Return precautions discussed and  provided. Patient discharged in stable condition with no unaddressed concerns.          Final Clinical Impression(s) / ED Diagnoses Final diagnoses:  COVID-19    Rx / DC Orders ED Discharge Orders     None         Antony Madura, Cordelia Poche 10/02/22 2315    Tilden Fossa, MD 10/03/22 [phone]

## 2022-12-22 DIAGNOSIS — M79629 Pain in unspecified upper arm: Secondary | ICD-10-CM | POA: Diagnosis not present

## 2022-12-22 DIAGNOSIS — M533 Sacrococcygeal disorders, not elsewhere classified: Secondary | ICD-10-CM | POA: Diagnosis not present

## 2022-12-22 DIAGNOSIS — M25552 Pain in left hip: Secondary | ICD-10-CM | POA: Diagnosis not present

## 2022-12-22 DIAGNOSIS — Z041 Encounter for examination and observation following transport accident: Secondary | ICD-10-CM | POA: Diagnosis not present

## 2022-12-22 DIAGNOSIS — M25551 Pain in right hip: Secondary | ICD-10-CM | POA: Diagnosis not present

## 2022-12-22 DIAGNOSIS — Y92410 Unspecified street and highway as the place of occurrence of the external cause: Secondary | ICD-10-CM | POA: Diagnosis not present

## 2022-12-22 DIAGNOSIS — M25512 Pain in left shoulder: Secondary | ICD-10-CM | POA: Diagnosis not present

## 2022-12-22 DIAGNOSIS — M542 Cervicalgia: Secondary | ICD-10-CM | POA: Diagnosis not present
